# Patient Record
Sex: Female | Born: 1935 | Race: Black or African American | Hispanic: No | Marital: Married | Smoking: Never smoker
Health system: Southern US, Community
[De-identification: ages and names within clinical notes are randomized; demographics above are authoritative.]

## PROBLEM LIST (undated history)

## (undated) DIAGNOSIS — M810 Age-related osteoporosis without current pathological fracture: Secondary | ICD-10-CM

## (undated) DIAGNOSIS — E785 Hyperlipidemia, unspecified: Secondary | ICD-10-CM

## (undated) DIAGNOSIS — E079 Disorder of thyroid, unspecified: Secondary | ICD-10-CM

## (undated) DIAGNOSIS — E119 Type 2 diabetes mellitus without complications: Secondary | ICD-10-CM

## (undated) DIAGNOSIS — M1990 Unspecified osteoarthritis, unspecified site: Secondary | ICD-10-CM

## (undated) HISTORY — DX: Age-related osteoporosis without current pathological fracture: M81.0

## (undated) HISTORY — DX: Unspecified osteoarthritis, unspecified site: M19.90

## (undated) HISTORY — PX: ABDOMINAL HYSTERECTOMY: SHX81

## (undated) HISTORY — DX: Type 2 diabetes mellitus without complications: E11.9

## (undated) HISTORY — DX: Hyperlipidemia, unspecified: E78.5

## (undated) HISTORY — DX: Disorder of thyroid, unspecified: E07.9

---

## 2004-02-22 ENCOUNTER — Ambulatory Visit: Payer: Self-pay | Admitting: Podiatry

## 2004-11-21 ENCOUNTER — Ambulatory Visit: Payer: Self-pay | Admitting: Internal Medicine

## 2005-11-24 ENCOUNTER — Ambulatory Visit: Payer: Self-pay | Admitting: Internal Medicine

## 2006-11-29 ENCOUNTER — Ambulatory Visit: Payer: Self-pay | Admitting: Internal Medicine

## 2007-03-25 ENCOUNTER — Ambulatory Visit: Payer: Self-pay | Admitting: Gastroenterology

## 2007-03-25 HISTORY — PX: COLONOSCOPY: SHX174

## 2007-12-13 ENCOUNTER — Ambulatory Visit: Payer: Self-pay | Admitting: Internal Medicine

## 2007-12-15 ENCOUNTER — Ambulatory Visit: Payer: Self-pay | Admitting: Internal Medicine

## 2008-12-17 ENCOUNTER — Ambulatory Visit: Payer: Self-pay | Admitting: Internal Medicine

## 2009-02-04 DEATH — deceased

## 2009-12-19 ENCOUNTER — Ambulatory Visit: Payer: Self-pay | Admitting: Internal Medicine

## 2010-10-09 ENCOUNTER — Ambulatory Visit: Payer: Self-pay

## 2010-12-23 ENCOUNTER — Ambulatory Visit: Payer: Self-pay | Admitting: Family Medicine

## 2011-01-28 ENCOUNTER — Ambulatory Visit: Payer: Self-pay

## 2011-02-18 ENCOUNTER — Ambulatory Visit: Payer: Self-pay

## 2011-12-24 ENCOUNTER — Ambulatory Visit: Payer: Self-pay | Admitting: Family Medicine

## 2013-01-19 ENCOUNTER — Ambulatory Visit: Payer: Self-pay | Admitting: Family Medicine

## 2013-06-26 DIAGNOSIS — M353 Polymyalgia rheumatica: Secondary | ICD-10-CM | POA: Insufficient documentation

## 2013-06-26 DIAGNOSIS — M1990 Unspecified osteoarthritis, unspecified site: Secondary | ICD-10-CM | POA: Insufficient documentation

## 2013-07-25 ENCOUNTER — Emergency Department: Payer: Self-pay | Admitting: Emergency Medicine

## 2013-09-10 ENCOUNTER — Emergency Department: Payer: Self-pay | Admitting: Emergency Medicine

## 2013-09-18 DIAGNOSIS — M7120 Synovial cyst of popliteal space [Baker], unspecified knee: Secondary | ICD-10-CM | POA: Insufficient documentation

## 2014-02-08 DIAGNOSIS — R7303 Prediabetes: Secondary | ICD-10-CM | POA: Insufficient documentation

## 2014-02-08 DIAGNOSIS — E7800 Pure hypercholesterolemia, unspecified: Secondary | ICD-10-CM | POA: Insufficient documentation

## 2014-02-08 DIAGNOSIS — E669 Obesity, unspecified: Secondary | ICD-10-CM | POA: Insufficient documentation

## 2014-05-02 ENCOUNTER — Ambulatory Visit: Payer: Self-pay | Admitting: Podiatry

## 2015-08-27 DIAGNOSIS — G8929 Other chronic pain: Secondary | ICD-10-CM | POA: Insufficient documentation

## 2015-08-27 DIAGNOSIS — M25511 Pain in right shoulder: Secondary | ICD-10-CM | POA: Insufficient documentation

## 2015-09-26 DIAGNOSIS — Z87898 Personal history of other specified conditions: Secondary | ICD-10-CM | POA: Insufficient documentation

## 2015-10-29 DIAGNOSIS — M1712 Unilateral primary osteoarthritis, left knee: Secondary | ICD-10-CM | POA: Insufficient documentation

## 2015-12-25 DIAGNOSIS — M170 Bilateral primary osteoarthritis of knee: Secondary | ICD-10-CM | POA: Insufficient documentation

## 2016-01-29 ENCOUNTER — Other Ambulatory Visit: Payer: Self-pay | Admitting: Internal Medicine

## 2016-01-29 DIAGNOSIS — M25361 Other instability, right knee: Secondary | ICD-10-CM

## 2016-02-08 ENCOUNTER — Ambulatory Visit
Admission: RE | Admit: 2016-02-08 | Discharge: 2016-02-08 | Disposition: A | Discharge status: Home / Self Care | Payer: Medicare Other | Source: Ambulatory Visit | Attending: Internal Medicine | Admitting: Internal Medicine

## 2016-02-08 ENCOUNTER — Ambulatory Visit: Payer: Self-pay

## 2016-02-08 DIAGNOSIS — M25361 Other instability, right knee: Secondary | ICD-10-CM | POA: Diagnosis present

## 2016-02-08 DIAGNOSIS — M7121 Synovial cyst of popliteal space [Baker], right knee: Secondary | ICD-10-CM | POA: Diagnosis not present

## 2016-02-08 DIAGNOSIS — M25461 Effusion, right knee: Secondary | ICD-10-CM | POA: Insufficient documentation

## 2016-02-08 DIAGNOSIS — M659 Synovitis and tenosynovitis, unspecified: Secondary | ICD-10-CM | POA: Insufficient documentation

## 2016-02-08 DIAGNOSIS — S83281A Other tear of lateral meniscus, current injury, right knee, initial encounter: Secondary | ICD-10-CM | POA: Diagnosis not present

## 2016-02-08 DIAGNOSIS — X58XXXA Exposure to other specified factors, initial encounter: Secondary | ICD-10-CM | POA: Diagnosis not present

## 2016-04-20 DIAGNOSIS — M25512 Pain in left shoulder: Secondary | ICD-10-CM | POA: Insufficient documentation

## 2016-05-04 DIAGNOSIS — M8589 Other specified disorders of bone density and structure, multiple sites: Secondary | ICD-10-CM | POA: Insufficient documentation

## 2016-09-24 DIAGNOSIS — Z66 Do not resuscitate: Secondary | ICD-10-CM | POA: Insufficient documentation

## 2016-09-24 DIAGNOSIS — Z0000 Encounter for general adult medical examination without abnormal findings: Secondary | ICD-10-CM | POA: Insufficient documentation

## 2017-04-15 DIAGNOSIS — G8929 Other chronic pain: Secondary | ICD-10-CM | POA: Insufficient documentation

## 2018-04-14 DIAGNOSIS — M25659 Stiffness of unspecified hip, not elsewhere classified: Secondary | ICD-10-CM | POA: Insufficient documentation

## 2018-09-08 DIAGNOSIS — M25551 Pain in right hip: Secondary | ICD-10-CM | POA: Insufficient documentation

## 2018-10-28 ENCOUNTER — Other Ambulatory Visit: Payer: Self-pay

## 2018-10-28 ENCOUNTER — Other Ambulatory Visit: Payer: Self-pay | Admitting: Internal Medicine

## 2018-10-28 DIAGNOSIS — G8929 Other chronic pain: Secondary | ICD-10-CM

## 2018-10-28 DIAGNOSIS — Z20822 Contact with and (suspected) exposure to covid-19: Secondary | ICD-10-CM

## 2018-10-31 LAB — NOVEL CORONAVIRUS, NAA: SARS-CoV-2, NAA: NOT DETECTED

## 2018-11-09 ENCOUNTER — Other Ambulatory Visit
Admission: RE | Admit: 2018-11-09 | Discharge: 2018-11-09 | Disposition: A | Discharge status: Home / Self Care | Payer: Medicare Other | Source: Ambulatory Visit | Attending: Internal Medicine | Admitting: Internal Medicine

## 2018-11-09 DIAGNOSIS — M1712 Unilateral primary osteoarthritis, left knee: Secondary | ICD-10-CM | POA: Insufficient documentation

## 2018-11-09 LAB — SYNOVIAL CELL COUNT + DIFF, W/ CRYSTALS
Crystals, Fluid: NONE SEEN
Eosinophils-Synovial: 0 %
Lymphocytes-Synovial Fld: 21 %
Monocyte-Macrophage-Synovial Fluid: 61 %
Neutrophil, Synovial: 18 %
WBC, Synovial: 419 /mm3 — ABNORMAL HIGH (ref 0–200)

## 2018-11-10 ENCOUNTER — Ambulatory Visit
Admission: RE | Admit: 2018-11-10 | Discharge: 2018-11-10 | Disposition: A | Discharge status: Home / Self Care | Payer: Medicare Other | Source: Ambulatory Visit | Attending: Internal Medicine | Admitting: Internal Medicine

## 2018-11-10 ENCOUNTER — Other Ambulatory Visit: Payer: Self-pay

## 2018-11-10 DIAGNOSIS — G8929 Other chronic pain: Secondary | ICD-10-CM | POA: Insufficient documentation

## 2018-11-10 DIAGNOSIS — M25551 Pain in right hip: Secondary | ICD-10-CM | POA: Insufficient documentation

## 2018-12-20 DIAGNOSIS — G8929 Other chronic pain: Secondary | ICD-10-CM | POA: Insufficient documentation

## 2019-02-10 DIAGNOSIS — E538 Deficiency of other specified B group vitamins: Secondary | ICD-10-CM | POA: Insufficient documentation

## 2019-08-18 DIAGNOSIS — F418 Other specified anxiety disorders: Secondary | ICD-10-CM | POA: Insufficient documentation

## 2019-10-17 DIAGNOSIS — I10 Essential (primary) hypertension: Secondary | ICD-10-CM | POA: Insufficient documentation

## 2019-10-23 ENCOUNTER — Other Ambulatory Visit: Payer: Self-pay

## 2019-10-23 ENCOUNTER — Encounter (INDEPENDENT_AMBULATORY_CARE_PROVIDER_SITE_OTHER): Payer: Self-pay | Admitting: Vascular Surgery

## 2019-10-23 ENCOUNTER — Encounter (INDEPENDENT_AMBULATORY_CARE_PROVIDER_SITE_OTHER): Payer: Self-pay

## 2019-10-23 ENCOUNTER — Ambulatory Visit (INDEPENDENT_AMBULATORY_CARE_PROVIDER_SITE_OTHER): Payer: Medicare Other | Admitting: Vascular Surgery

## 2019-10-23 VITALS — BP 151/71 | HR 94 | Resp 18 | Ht 59.0 in | Wt 175.0 lb

## 2019-10-23 DIAGNOSIS — I739 Peripheral vascular disease, unspecified: Secondary | ICD-10-CM

## 2019-10-23 DIAGNOSIS — M79605 Pain in left leg: Secondary | ICD-10-CM

## 2019-10-23 DIAGNOSIS — I872 Venous insufficiency (chronic) (peripheral): Secondary | ICD-10-CM | POA: Insufficient documentation

## 2019-10-23 DIAGNOSIS — M79606 Pain in leg, unspecified: Secondary | ICD-10-CM | POA: Insufficient documentation

## 2019-10-23 DIAGNOSIS — M170 Bilateral primary osteoarthritis of knee: Secondary | ICD-10-CM | POA: Diagnosis not present

## 2019-10-23 DIAGNOSIS — M79604 Pain in right leg: Secondary | ICD-10-CM

## 2019-10-23 NOTE — Progress Notes (Signed)
MRN : [id number]  Jasmin Jones is a 86 y.o. (11/27/1930) female who presents with chief complaint of No chief complaint on file. Marland Kitchen  History of Present Illness:   The patient is seen for evaluation of painful lower extremities. Patient notes the pain is variable and not always associated with activity.  The pain is somewhat consistent day to day occurring on most days. The patient notes the pain also occurs with standing and routinely seems worse as the day wears on. The pain has been progressive over the past several years. The patient states these symptoms are causing  a profound negative impact on quality of life and daily activities.  The patient denies rest pain or dangling of an extremity off the side of the bed during the night for relief. No open wounds or sores at this time. No history of DVT or phlebitis. No prior interventions or surgeries.  There is a  history of back problems and DJD of the lumbar and sacral spine.    No outpatient medications have been marked as taking for the 10/23/19 encounter (Appointment) with Gilda Crease, Latina Craver, MD.    No past medical history on file.    Social History Social History   Tobacco Use  . Smoking status: Not on file  Substance Use Topics  . Alcohol use: Not on file  . Drug use: Not on file    Family History No family history of bleeding/clotting disorders, porphyria or autoimmune disease   Not on File   REVIEW OF SYSTEMS (Negative unless checked)  Constitutional: [] Weight loss  [] Fever  [] Chills Cardiac: [] Chest pain   [] Chest pressure   [] Palpitations   [] Shortness of breath when laying flat   [] Shortness of breath with exertion. Vascular:  [x] Pain in legs with walking   [x] Pain in legs at rest  [] History of DVT   [] Phlebitis   [] Swelling in legs   [] Varicose veins   [] Non-healing ulcers Pulmonary:   [] Uses home oxygen   [] Productive cough   [] Hemoptysis   [] Wheeze  [] COPD   [] Asthma Neurologic:  [] Dizziness    [] Seizures   [] History of stroke   [] History of TIA  [] Aphasia   [] Vissual changes   [] Weakness or numbness in arm   [] Weakness or numbness in leg Musculoskeletal:   [] Joint swelling   [x] Joint pain   [x] Low back pain Hematologic:  [] Easy bruising  [] Easy bleeding   [] Hypercoagulable state   [] Anemic Gastrointestinal:  [] Diarrhea   [] Vomiting  [] Gastroesophageal reflux/heartburn   [] Difficulty swallowing. Genitourinary:  [] Chronic kidney disease   [] Difficult urination  [] Frequent urination   [] Blood in urine Skin:  [] Rashes   [] Ulcers  Psychological:  [] History of anxiety   []  History of major depression.  Physical Examination  There were no vitals filed for this visit. There is no height or weight on file to calculate BMI. Gen: WD/WN, NAD Head: [place]/AT, No temporalis wasting.  Ear/Nose/Throat: Hearing grossly intact, nares w/o erythema or drainage, poor dentition Eyes: PER, EOMI, sclera nonicteric.  Neck: Supple, no masses.  No bruit or JVD.  Pulmonary:  Good air movement, clear to auscultation bilaterally, no use of accessory muscles.  Cardiac: RRR, normal S1, S2, no Murmurs. Vascular: no carotid bruits; scattered varicosities present bilaterally.  Mild venous stasis changes to the legs bilaterally.  2+ soft pitting edema Vessel Right Left  Radial Palpable Palpable  PT Not Palpable Palpable  DP Not Palpable Palpable  Gastrointestinal: soft, non-distended. No guarding/no peritoneal signs.  Musculoskeletal:  M/S 5/5 throughout.  No deformity or atrophy.  Neurologic: CN 2-12 intact. Pain and light touch intact in extremities.  Symmetrical.  Speech is fluent. Motor exam as listed above. Psychiatric: Judgment intact, Mood & affect appropriate for pt's clinical situation. Dermatologic: Mild venous rashes no ulcers noted.  No changes consistent with cellulitis.   CBC No results found for: WBC, HGB, HCT, MCV, PLT  BMET No results found for: NA, K, CL, CO2, GLUCOSE, BUN, CREATININE,  CALCIUM, GFRNONAA, GFRAA CrCl cannot be calculated (No successful lab value found.).  COAG No results found for: INR, PROTIME  Radiology No results found.    Assessment/Plan 1. Pain in both lower extremities Recommend:  The patient has atypical pain symptoms for pure atherosclerotic disease. However, on physical exam there is evidence of mixed venous and arterial disease, given the diminished pulses and the edema associated with venous changes of the legs.  Noninvasive studies including ABI's will be obtained and the patient will follow up with me to review these studies.  I suspect the patient is c/o pseudoclaudication.  Patient should have an evaluation of his LS spine which I defer to the primary service.  The patient should continue walking and begin a more formal exercise program. The patient should continue his antiplatelet therapy and aggressive treatment of the lipid abnormalities.  The patient should begin wearing graduated compression socks 10-15 mmHg strength to control edema.  - VAS [place] ABI WITH/WO TBI; Future  2. PAD (peripheral artery disease) (HCC) Recommend:  The patient has atypical pain symptoms for pure atherosclerotic disease. However, on physical exam there is evidence of mixed venous and arterial disease, given the diminished pulses and the edema associated with venous changes of the legs.  Noninvasive studies including ABI's will be obtained and the patient will follow up with me to review these studies.  I suspect the patient is c/o pseudoclaudication.  Patient should have an evaluation of his LS spine which I defer to the primary service.  The patient should continue walking and begin a more formal exercise program. The patient should continue his antiplatelet therapy and aggressive treatment of the lipid abnormalities.  The patient should begin wearing graduated compression socks 10-15 mmHg strength to control edema.  - VAS [place] ABI WITH/WO TBI;  Future  3. Chronic venous insufficiency I have had a long discussion with the patient regarding swelling and why it  causes symptoms.  Patient will begin wearing graduated compression stockings on a daily basis a prescription was given. The patient will  beginning wearing the stockings first thing in the morning and removing them in the evening. The patient is instructed specifically not to sleep in the stockings.   In addition, behavioral modification will be initiated.  This will include frequent elevation, use of over the counter pain medications and exercise such as walking.  I have reviewed systemic causes for chronic edema such as liver, kidney and cardiac etiologies.  The patient denies problems with these organ systems.    Consideration for a lymph pump will also be made based upon the effectiveness of conservative therapy.  This would help to improve the edema control and prevent sequela such as ulcers and infections    4. Primary osteoarthritis of both knees Continue NSAID medications as already ordered, these medications have been reviewed and there are no changes at this time.  Continued activity and therapy was stressed.     Levora Dredge, MD  10/23/2019 8:21 AM

## 2019-11-15 ENCOUNTER — Ambulatory Visit (INDEPENDENT_AMBULATORY_CARE_PROVIDER_SITE_OTHER): Payer: Medicare Other

## 2019-11-15 ENCOUNTER — Ambulatory Visit (INDEPENDENT_AMBULATORY_CARE_PROVIDER_SITE_OTHER): Payer: Medicare Other | Admitting: Nurse Practitioner

## 2019-11-15 ENCOUNTER — Encounter (INDEPENDENT_AMBULATORY_CARE_PROVIDER_SITE_OTHER): Payer: Self-pay | Admitting: Nurse Practitioner

## 2019-11-15 ENCOUNTER — Other Ambulatory Visit: Payer: Self-pay

## 2019-11-15 VITALS — BP 131/79 | HR 81 | Ht 59.0 in | Wt 174.0 lb

## 2019-11-15 DIAGNOSIS — M79605 Pain in left leg: Secondary | ICD-10-CM | POA: Diagnosis not present

## 2019-11-15 DIAGNOSIS — M79604 Pain in right leg: Secondary | ICD-10-CM

## 2019-11-15 DIAGNOSIS — M170 Bilateral primary osteoarthritis of knee: Secondary | ICD-10-CM

## 2019-11-15 DIAGNOSIS — I10 Essential (primary) hypertension: Secondary | ICD-10-CM

## 2019-11-15 DIAGNOSIS — I739 Peripheral vascular disease, unspecified: Secondary | ICD-10-CM | POA: Diagnosis not present

## 2019-11-15 NOTE — Progress Notes (Signed)
Subjective:    Patient ID: Jasmin Jones, female    DOB: 1931-02-14, 86 y.o.   MRN: [id number] Chief Complaint  Patient presents with  . Follow-up    U/S follow up    The patient presents today to follow-up with noninvasive studies due to bilateral lower extremity leg pain.  The patient has a known history of osteoarthritis in both her hip as well as her knee.  The patient notes that she recently had a shot in her back and that has helped a good deal of her lower extremity symptoms.  She continues to have knee pain.  The patient also has polymyalgia rheumatica.  The patient was sent to rule out for peripheral arterial disease due to the extensiveness of her pain, to ensure that a more urgent vascular problem is not missed.  She denies any claudication-like symptoms.  She denies any fever, chills, nausea, vomiting or diarrhea.  She denies any TIA-like symptoms.  She denies any chest pain or shortness of breath.  Today noninvasive studies show ABI 1.14 on the right and 1.23 on the left.  The patient has a TBI 0.7 on the right and 0.93 on the left.  The patient has triphasic tibial artery waveforms on the left with biphasic/triphasic waveforms in the tibial arteries on the right.  The patient has strong toe waveforms bilaterally.   Review of Systems  Musculoskeletal: Positive for arthralgias, joint swelling and myalgias.  Neurological: Positive for weakness.  All other systems reviewed and are negative.      Objective:   Physical Exam Vitals reviewed.  HENT:     Head: Normocephalic.  Cardiovascular:     Rate and Rhythm: Normal rate and regular rhythm.     Pulses: Normal pulses.          Dorsalis pedis pulses are 2+ on the right side and 2+ on the left side.       Posterior tibial pulses are 2+ on the right side and 2+ on the left side.     Heart sounds: Normal heart sounds.  Pulmonary:     Effort: Pulmonary effort is normal.  Musculoskeletal:     Right lower leg: 2+ Edema present.       Left lower leg: 2+ Edema present.  Neurological:     Mental Status: She is alert and oriented to person, place, and time.     Motor: Weakness present.     Gait: Gait abnormal.  Psychiatric:        Mood and Affect: Mood normal.        Behavior: Behavior normal.        Thought Content: Thought content normal.        Judgment: Judgment normal.     BP 131/79   Pulse 81   Ht 4\' 11"  (1.499 m)   Wt 174 lb (78.9 kg)   BMI 35.14 kg/m   Past Medical History:  Diagnosis Date  . Diabetes mellitus without complication (HCC)   . Hyperlipidemia   . Osteoarthritis   . Osteoporosis   . Thyroid disease     Social History   Socioeconomic History  . Marital status: Married    Spouse name: Not on file  . Number of children: Not on file  . Years of education: Not on file  . Highest education level: Not on file  Occupational History  . Not on file  Tobacco Use  . Smoking status: Never Smoker  . Smokeless tobacco: Never Used  Substance and Sexual Activity  . Alcohol use: Not on file  . Drug use: Not on file  . Sexual activity: Not on file  Other Topics Concern  . Not on file  Social History Narrative  . Not on file   Social Determinants of Health   Financial Resource Strain:   . Difficulty of Paying Living Expenses:   Food Insecurity:   . Worried About Programme researcher, broadcasting/film/video in the Last Year:   . Barista in the Last Year:   Transportation Needs:   . Freight forwarder (Medical):   Marland Kitchen Lack of Transportation (Non-Medical):   Physical Activity:   . Days of Exercise per Week:   . Minutes of Exercise per Session:   Stress:   . Feeling of Stress :   Social Connections:   . Frequency of Communication with Friends and Family:   . Frequency of Social Gatherings with Friends and Family:   . Attends Religious Services:   . Active Member of Clubs or Organizations:   . Attends Banker Meetings:   Marland Kitchen Marital Status:   Intimate Partner Violence:   . Fear of  Current or Ex-Partner:   . Emotionally Abused:   Marland Kitchen Physically Abused:   . Sexually Abused:     Past Surgical History:  Procedure Laterality Date  . ABDOMINAL HYSTERECTOMY    . ABDOMINAL HYSTERECTOMY    . COLONOSCOPY  03/25/2007    Family History  Problem Relation Age of Onset  . Heart disease Mother   . Hypertension Father   . Diabetes Brother   . Heart attack Brother   . Cancer Maternal Uncle     Allergies  Allergen Reactions  . Lisinopril Cough       Assessment & Plan:   1. Pain in both lower extremities Recommend:  I do not find evidence of Vascular pathology that would explain the patient's symptoms  The patient has atypical pain symptoms for vascular disease  The patient does have known degenerative disc disease in her hip and knees, as well as polymyalgia rheumatica, these are being addressed by her rheumatologist as well as primary care physician.  Noninvasive studies  of the legs do not identify vascular problems  The patient should continue walking and begin a more formal exercise program. The patient should continue his antiplatelet therapy and aggressive treatment of the lipid abnormalities. The patient should begin wearing graduated compression socks 15-20 mmHg strength to control her mild edema.  Patient will follow-up with me on a PRN basis  Further work-up of her lower extremity pain is deferred to the primary service     2. Essential hypertension Continue antihypertensive medications as already ordered, these medications have been reviewed and there are no changes at this time.   3. Primary osteoarthritis of both knees Continue NSAID medications as already ordered, these medications have been reviewed and there are no changes at this time.  Continued activity and therapy was stressed.    Current Outpatient Medications on File Prior to Visit  Medication Sig Dispense Refill  . acetaminophen (TYLENOL) 500 MG tablet Take by mouth.    Marland Kitchen  albuterol (VENTOLIN HFA) 108 (90 Base) MCG/ACT inhaler Inhale into the lungs.    Marland Kitchen atorvastatin (LIPITOR) 20 MG tablet Take by mouth.    . Calcium Carbonate-Vitamin D 600-200 MG-UNIT TABS Take 2 tablets by mouth daily.    . cyanocobalamin 1000 MCG tablet Take by mouth.    . escitalopram (LEXAPRO)  10 MG tablet Take 10 mg by mouth daily.    Marland Kitchen escitalopram (LEXAPRO) 5 MG tablet Take by mouth.    . fluticasone (FLONASE) 50 MCG/ACT nasal spray SPRAY 2 SPRAYS INTO EACH NOSTRIL EVERY DAY    . hydrochlorothiazide (HYDRODIURIL) 12.5 MG tablet Take 1 tablet by mouth daily.    Marland Kitchen levocetirizine (XYZAL) 5 MG tablet every evening.    Marland Kitchen losartan (COZAAR) 50 MG tablet Take by mouth.    Marland Kitchen ofloxacin (OCUFLOX) 0.3 % ophthalmic solution Place 1 drop into the right eye 4 (four) times daily.     No current facility-administered medications on file prior to visit.    There are no Patient Instructions on file for this visit. No follow-ups on file.   Georgiana Spinner, NP

## 2020-05-19 ENCOUNTER — Encounter: Payer: Self-pay | Admitting: Emergency Medicine

## 2020-05-19 ENCOUNTER — Other Ambulatory Visit: Payer: Self-pay

## 2020-05-19 ENCOUNTER — Emergency Department
Admission: EM | Admit: 2020-05-19 | Discharge: 2020-05-19 | Disposition: A | Discharge status: Home / Self Care | Payer: Medicare Other | Attending: Emergency Medicine | Admitting: Emergency Medicine

## 2020-05-19 DIAGNOSIS — I10 Essential (primary) hypertension: Secondary | ICD-10-CM | POA: Diagnosis not present

## 2020-05-19 DIAGNOSIS — Z79899 Other long term (current) drug therapy: Secondary | ICD-10-CM | POA: Insufficient documentation

## 2020-05-19 DIAGNOSIS — E119 Type 2 diabetes mellitus without complications: Secondary | ICD-10-CM | POA: Insufficient documentation

## 2020-05-19 LAB — BASIC METABOLIC PANEL
Anion gap: 9 (ref 5–15)
BUN: 13 mg/dL (ref 8–23)
CO2: 29 mmol/L (ref 22–32)
Calcium: 9.1 mg/dL (ref 8.9–10.3)
Chloride: 98 mmol/L (ref 98–111)
Creatinine, Ser: 0.58 mg/dL (ref 0.44–1.00)
GFR, Estimated: >60 mL/min (ref >60)
Glucose, Bld: 125 mg/dL — ABNORMAL HIGH (ref 70–99)
Potassium: 3.5 mmol/L (ref 3.5–5.1)
Sodium: 136 mmol/L (ref 135–145)

## 2020-05-19 LAB — CBC
HCT: 38 % (ref 36.0–46.0)
Hemoglobin: 12.3 g/dL (ref 12.0–15.0)
MCH: 30.2 pg (ref 26.0–34.0)
MCHC: 32.4 g/dL (ref 30.0–36.0)
MCV: 93.4 fL (ref 80.0–100.0)
Platelets: 255 10*3/uL (ref 150–400)
RBC: 4.07 MIL/uL (ref 3.87–5.11)
RDW: 12.8 % (ref 11.5–15.5)
WBC: 6.6 10*3/uL (ref 4.0–10.5)
nRBC: 0 % (ref 0.0–0.2)

## 2020-05-19 LAB — TROPONIN I (HIGH SENSITIVITY): Troponin I (High Sensitivity): 3 ng/L (ref <18)

## 2020-05-19 NOTE — ED Provider Notes (Signed)
Medical screening examination/treatment/procedure(s) were conducted as a shared visit with non-physician practitioner(s) and myself.  I personally evaluated the patient during the encounter.    ED ECG REPORT I, Sharyn Creamer, the attending physician, personally viewed and interpreted this ECG.  Date: 05/19/2020 EKG Time: 1016 Rate: 90 Rhythm: normal sinus rhythm QRS Axis: normal Intervals: normal ST/T Wave abnormalities: normal Narrative Interpretation: no evidence of acute ischemia    Sharyn Creamer, MD 05/19/20 1433

## 2020-05-19 NOTE — ED Provider Notes (Signed)
[facility] Emergency Department Provider Note  ____________________________________________  1431  I have reviewed the triage vital signs and the nursing notes.   HISTORY  Chief Complaint Hypertension  HPI Jasmin Jones is a 87 y.o. female presents herself to the ED for evaluation of concern over elevated blood pressure readings this morning.  Patient describes BP with systolics in the 190s & dyastolics in the low 100s, upon awakening this morning.  She denies any preceding or ongoing rest pain or shortness of breath.  Patient reports taking her blood pressure medicines as prescribed.  She denies any other complaints at this time.  Past Medical History:  Diagnosis Date  . Diabetes mellitus without complication (HCC)   . Hyperlipidemia   . Osteoarthritis   . Osteoporosis   . Thyroid disease     Patient Active Problem List   Diagnosis Date Noted  . Leg pain 10/23/2019  . PAD (peripheral artery disease) (HCC) 10/23/2019  . Chronic venous insufficiency 10/23/2019  . Essential hypertension 10/17/2019  . Situational anxiety 08/18/2019  . B12 deficiency 02/10/2019  . Chronic bilateral low back pain without sciatica 12/20/2018  . Chronic right hip pain 09/08/2018  . Stiffness of hip joint 04/14/2018  . Chronic thumb pain, left 04/15/2017  . DNR (do not resuscitate) 09/24/2016  . Medicare annual wellness visit, subsequent 09/24/2016  . Osteopenia of multiple sites 05/04/2016  . Bilateral shoulder pain 04/20/2016  . Primary osteoarthritis of both knees 12/25/2015  . Localized osteoarthritis of left knee 10/29/2015  . History of chronic health problem 09/26/2015  . Chronic pain in right shoulder 08/27/2015  . Chronic pain of left knee 08/27/2015  . Borderline diabetes 02/08/2014  . Obesity (BMI 35.0-39.9 without comorbidity) 02/08/2014  . Pure hypercholesterolemia 02/08/2014  . Baker's cyst of knee 09/18/2013  . PMR (polymyalgia rheumatica) (HCC)  06/26/2013  . Unspecified osteoarthritis, unspecified site 06/26/2013    Past Surgical History:  Procedure Laterality Date  . ABDOMINAL HYSTERECTOMY    . ABDOMINAL HYSTERECTOMY    . COLONOSCOPY  03/25/2007    Prior to Admission medications   Medication Sig Start Date End Date Taking? Authorizing Provider  acetaminophen (TYLENOL) 500 MG tablet Take by mouth. 12/20/18   [provider]  albuterol (VENTOLIN HFA) 108 (90 Base) MCG/ACT inhaler Inhale into the lungs. 05/12/19   [provider]  atorvastatin (LIPITOR) 20 MG tablet Take by mouth. 08/02/19   [provider]  Calcium Carbonate-Vitamin D 600-200 MG-UNIT TABS Take 2 tablets by mouth daily.    [provider]  cyanocobalamin 1000 MCG tablet Take by mouth.    [provider]  escitalopram (LEXAPRO) 10 MG tablet Take 10 mg by mouth daily.    [provider]  escitalopram (LEXAPRO) 5 MG tablet Take by mouth. 09/11/19 12/10/19  [provider]  fluticasone (FLONASE) 50 MCG/ACT nasal spray SPRAY 2 SPRAYS INTO EACH NOSTRIL EVERY DAY 08/03/19   [provider]  hydrochlorothiazide (HYDRODIURIL) 12.5 MG tablet Take 1 tablet by mouth daily. 05/30/19   [provider]  levocetirizine (XYZAL) 5 MG tablet every evening. 04/28/19   [provider]  losartan (COZAAR) 50 MG tablet Take by mouth. 07/12/19 07/11/20  [provider]  ofloxacin (OCUFLOX) 0.3 % ophthalmic solution Place 1 drop into the right eye 4 (four) times daily. 09/30/19   [provider]    Allergies Lisinopril  Family History  Problem Relation Age of Onset  . Heart disease Mother   .  Hypertension Father   . Diabetes Brother   . Heart attack Brother   . Cancer Maternal Uncle     Social History Social History   Tobacco Use  . Smoking status: Never Smoker  . Smokeless tobacco: Never Used    Review of Systems Constitutional: No fever/chills Eyes: No visual changes. ENT: No  sore throat. Cardiovascular: Denies chest pain. Respiratory: Denies shortness of breath. Gastrointestinal: No abdominal pain.  No nausea, no vomiting.  No diarrhea.  No constipation. Genitourinary: Negative for dysuria. Musculoskeletal: Negative for back pain. Skin: Negative for rash. Neurological: Negative for headaches, focal weakness or numbness. ____________________________________________   PHYSICAL EXAM:  VITAL SIGNS: ED Triage Vitals  Enc Vitals Group     BP 05/19/20 1249 (!) 145/76     Pulse Rate 05/19/20 1249 80     Resp 05/19/20 1249 19     Temp --      Temp src --      SpO2 05/19/20 1249 98 %     Weight 05/19/20 1016 175 lb (79.4 kg)     Height 05/19/20 1016 4\' 11"  (1.499 m)     Head Circumference --      Peak Flow --      Pain Score 05/19/20 1016 0     Pain Loc --      Pain Edu? --      Excl. in GC? --    Constitutional: Alert and oriented. Well appearing and in no acute distress. Eyes: Conjunctivae are normal. EOMI. Head: Atraumatic. Nose: No congestion/rhinnorhea. Mouth/Throat: Mucous membranes are moist.  Oropharynx non-erythematous. Neck: No stridor.   Cardiovascular: Normal rate, regular rhythm. Grossly normal heart sounds.  Good peripheral circulation. Respiratory: Normal respiratory effort.  No retractions. Lungs CTAB. Gastrointestinal: Soft and nontender. No distention. No abdominal bruits. No CVA tenderness. Musculoskeletal: No lower extremity tenderness nor edema.  No joint effusions. Neurologic:  Normal speech and language. No gross focal neurologic deficits are appreciated. No gait instability. Skin:  Skin is warm, dry and intact. No rash noted. Psychiatric: Mood and affect are normal. Speech and behavior are normal. ____________________________________________   LABS (all labs ordered are listed, but only abnormal results are displayed)  Labs Reviewed  BASIC METABOLIC PANEL - Abnormal; Notable for the following components:      Result Value    Glucose, Bld 125 (*)    All other components within normal limits  CBC  TROPONIN I (HIGH SENSITIVITY)  TROPONIN I (HIGH SENSITIVITY)   ____________________________________________  EKG  See EKG report ____________________________________________  RADIOLOGY I, 05/21/20, personally viewed and evaluated these images (plain radiographs) as part of my medical decision making, as well as reviewing the written report by the radiologist.  ED MD interpretation:    Official radiology report(s): No results found.  ____________________________________________   PROCEDURES  Procedure(s) performed (including Critical Care):  Procedures   ____________________________________________   INITIAL IMPRESSION / ASSESSMENT AND PLAN / ED COURSE  As part of my medical decision making, I reviewed the following data within the electronic MEDICAL RECORD NUMBER Labs reviewed WNL without elevated troponin, EKG interpreted no significant changes from prior EKG, Notes from prior ED visits and [place] Controlled Substance Database     Geriatric patient with ED evaluation of elevated blood pressures this morning without subsequent complaint of chest pain, shortness of breath, or visual disturbance.  Patient's exam is overall nonreturn at this time.  Labs within normal limits initial troponin.  Patient's blood pressures have normalized to  140s over 76 and the patient is again without complaint at this time.  She will be discharged to follow-up with primary provider for ongoing symptoms.  Return precautions have been discussed. ____________________________________________   FINAL CLINICAL IMPRESSION(S) / ED DIAGNOSES  Final diagnoses:  Hypertension, unspecified type     ED Discharge Orders    None      *Please note:  Jasmin Jones was evaluated in Emergency Department on 05/19/2020 for the symptoms described in the history of present illness. She was evaluated in the context of the global  COVID-19 pandemic, which necessitated consideration that the patient might be at risk for infection with the SARS-CoV-2 virus that causes COVID-19. Institutional protocols and algorithms that pertain to the evaluation of patients at risk for COVID-19 are in a state of rapid change based on information released by regulatory bodies including the CDC and federal and state organizations. These policies and algorithms were followed during the patient's care in the ED.  Some ED evaluations and interventions may be delayed as a result of limited staffing during and the pandemic.*   Note:  This document was prepared using Dragon voice recognition software and may include unintentional dictation errors.    Lissa Hoard, PA-C 05/19/20 1446    Sharyn Creamer, MD 05/19/20 1517

## 2020-05-19 NOTE — ED Notes (Signed)
Pt verbalized understanding of d/c instructions at this time. Pt denied further questions at this time. Pt ambulatory to lobby at this time, NAD noted, steady gait noted, RR even and unlabored at this time.  

## 2020-05-19 NOTE — Discharge Instructions (Signed)
Your exam and labs are normal at this time.  No signs of any serious in infection or heart damage.  Follow-up with your primary provider tomorrow as planned.  Return to the ED if needed.

## 2020-05-19 NOTE — ED Triage Notes (Signed)
Pt reports her blood pressure has been going up. Pt denies any symptoms. Pt states that she does not feel bad when it goes up she just doesn't know why it goes up

## 2020-09-22 ENCOUNTER — Emergency Department
Admission: EM | Admit: 2020-09-22 | Discharge: 2020-09-22 | Disposition: A | Discharge status: Home / Self Care | Payer: Medicare Other | Attending: Emergency Medicine | Admitting: Emergency Medicine

## 2020-09-22 ENCOUNTER — Other Ambulatory Visit: Payer: Self-pay

## 2020-09-22 ENCOUNTER — Encounter: Payer: Self-pay | Admitting: Emergency Medicine

## 2020-09-22 DIAGNOSIS — Z79899 Other long term (current) drug therapy: Secondary | ICD-10-CM | POA: Insufficient documentation

## 2020-09-22 DIAGNOSIS — E119 Type 2 diabetes mellitus without complications: Secondary | ICD-10-CM | POA: Diagnosis not present

## 2020-09-22 DIAGNOSIS — I10 Essential (primary) hypertension: Secondary | ICD-10-CM | POA: Insufficient documentation

## 2020-09-22 MED ORDER — AMLODIPINE BESYLATE 2.5 MG PO TABS: 2.5000 mg | ORAL_TABLET | Freq: Every day | ORAL | 0 refills | Status: AC

## 2020-09-22 NOTE — ED Notes (Signed)
Pt to room in wheelchair. Pt advised her BP was high this morning. Pt took her medication as prescribed today. Pt denies any other symptoms. Pt last saw her PCP in January.

## 2020-09-22 NOTE — ED Provider Notes (Signed)
[facility] Emergency Department Provider Note ____________________________________________  Time seen: 1440  I have reviewed the triage vital signs and the nursing notes.  HISTORY  Chief Complaint  Hypertension   HPI Jasmin Jones is a 87 y.o. female presents to the ER today with complaint of elevated blood pressures.  She reports her blood pressure this morning was 187/95.  She reports feeling some pressure in her head but denies dizziness or visual changes.  She denies chest pain, chest tightness or shortness of breath.  She reports she intermittently has swelling around her ankles but reports this has not been bad today.  She is taking Losartan as prescribed.  Past Medical History:  Diagnosis Date   Diabetes mellitus without complication (HCC)    Hyperlipidemia    Osteoarthritis    Osteoporosis    Thyroid disease     Patient Active Problem List   Diagnosis Date Noted   Leg pain 10/23/2019   PAD (peripheral artery disease) (HCC) 10/23/2019   Chronic venous insufficiency 10/23/2019   Essential hypertension 10/17/2019   Situational anxiety 08/18/2019   B12 deficiency 02/10/2019   Chronic bilateral low back pain without sciatica 12/20/2018   Chronic right hip pain 09/08/2018   Stiffness of hip joint 04/14/2018   Chronic thumb pain, left 04/15/2017   DNR (do not resuscitate) 09/24/2016   Medicare annual wellness visit, subsequent 09/24/2016   Osteopenia of multiple sites 05/04/2016   Bilateral shoulder pain 04/20/2016   Primary osteoarthritis of both knees 12/25/2015   Localized osteoarthritis of left knee 10/29/2015   History of chronic health problem 09/26/2015   Chronic pain in right shoulder 08/27/2015   Chronic pain of left knee 08/27/2015   Borderline diabetes 02/08/2014   Obesity (BMI 35.0-39.9 without comorbidity) 02/08/2014   Pure hypercholesterolemia 02/08/2014   Baker's cyst of knee 09/18/2013   PMR (polymyalgia rheumatica) (HCC)  06/26/2013   Unspecified osteoarthritis, unspecified site 06/26/2013    Past Surgical History:  Procedure Laterality Date   ABDOMINAL HYSTERECTOMY     ABDOMINAL HYSTERECTOMY     COLONOSCOPY  03/25/2007    Prior to Admission medications   Medication Sig Start Date End Date Taking? Authorizing Provider  amLODipine (NORVASC) 2.5 MG tablet Take 1 tablet (2.5 mg total) by mouth daily. 09/22/20 09/22/21 Yes Agostino Gorin, Salvadore Oxford, NP  acetaminophen (TYLENOL) 500 MG tablet Take by mouth. 12/20/18   [provider]  albuterol (VENTOLIN HFA) 108 (90 Base) MCG/ACT inhaler Inhale into the lungs. 05/12/19   [provider]  atorvastatin (LIPITOR) 20 MG tablet Take by mouth. 08/02/19   [provider]  Calcium Carbonate-Vitamin D 600-200 MG-UNIT TABS Take 2 tablets by mouth daily.    [provider]  cyanocobalamin 1000 MCG tablet Take by mouth.    [provider]  escitalopram (LEXAPRO) 10 MG tablet Take 10 mg by mouth daily.    [provider]  escitalopram (LEXAPRO) 5 MG tablet Take by mouth. 09/11/19 12/10/19  [provider]  fluticasone (FLONASE) 50 MCG/ACT nasal spray SPRAY 2 SPRAYS INTO EACH NOSTRIL EVERY DAY 08/03/19   [provider]  hydrochlorothiazide (HYDRODIURIL) 12.5 MG tablet Take 1 tablet by mouth daily. 05/30/19   [provider]  levocetirizine (XYZAL) 5 MG tablet every evening. 04/28/19   [provider]  losartan (COZAAR) 50 MG tablet Take by mouth. 07/12/19 07/11/20  [provider]  ofloxacin (OCUFLOX) 0.3 % ophthalmic solution Place 1 drop into the right eye 4 (four) times daily.  09/30/19   [provider]    Allergies Lisinopril  Family History  Problem Relation Age of Onset   Heart disease Mother    Hypertension Father    Diabetes Brother    Heart attack Brother    Cancer Maternal Uncle     Social History Social History   Tobacco Use   Smoking status: Never   Smokeless  tobacco: Never    Review of Systems  Constitutional: Negative for fever, chills or body aches. Eyes: Negative for visual changes. Cardiovascular: Negative for chest pain or chest tightness. Respiratory: Negative for shortness of breath. Neurological: Negative for headaches, focal weakness, tingling or numbness. ____________________________________________  PHYSICAL EXAM:  VITAL SIGNS: ED Triage Vitals  Enc Vitals Group     BP 09/22/20 1314 (!) 183/91     Pulse Rate 09/22/20 1314 78     Resp 09/22/20 1314 20     Temp 09/22/20 1314 98.6 F (37 C)     Temp Source 09/22/20 1314 Oral     SpO2 09/22/20 1314 97 %     Weight 09/22/20 1313 180 lb (81.6 kg)     Height 09/22/20 1313 4\' 11"  (1.499 m)     Head Circumference --      Peak Flow --      Pain Score 09/22/20 1313 0     Pain Loc --      Pain Edu? --      Excl. in GC? --     Constitutional: Alert and oriented. Well appearing and in no distress. Head: Normocephalic Eyes: Normal extraocular movements Cardiovascular: Normal rate, regular rhythm. Respiratory: Normal respiratory effort. No wheezes/rales/rhonchi. Neurologic:  Normal speech and language. No gross focal neurologic deficits are appreciated. _____________________________________________  INITIAL IMPRESSION / ASSESSMENT AND PLAN / ED COURSE  HTN:  Repeat BP 167/87 Continue Losartan Will add Amlodipine 2.5 mg daily Reinforced DASH diet Will have her follow up with PCP in 1 week for reevaluation ____________________________________________  FINAL CLINICAL IMPRESSION(S) / ED DIAGNOSES  Final diagnoses:  Primary hypertension      09/24/20, NP 09/22/20 1508    09/24/20, MD 09/22/20 1528

## 2020-09-22 NOTE — Discharge Instructions (Addendum)
You were seen today for elevated blood pressure.  You should continue losartan.  I have sent in amlodipine 2.5 mg for you to take in addition to your losartan.  Please follow-up with your PCP for further evaluation

## 2020-09-22 NOTE — ED Triage Notes (Signed)
Pt reports her BP was high this am. Pt denies any other symptom. Pt denies pain, SOB, HA and all other complaints just concerned about BP

## 2020-10-03 ENCOUNTER — Emergency Department
Admission: EM | Admit: 2020-10-03 | Discharge: 2020-10-03 | Disposition: A | Discharge status: Home / Self Care | Payer: Medicare Other | Source: Home / Self Care | Attending: Emergency Medicine | Admitting: Emergency Medicine

## 2020-10-03 ENCOUNTER — Other Ambulatory Visit: Payer: Self-pay

## 2020-10-03 ENCOUNTER — Encounter: Payer: Self-pay | Admitting: Emergency Medicine

## 2020-10-03 ENCOUNTER — Emergency Department: Payer: Medicare Other

## 2020-10-03 ENCOUNTER — Emergency Department
Admission: EM | Admit: 2020-10-03 | Discharge: 2020-10-03 | Disposition: A | Discharge status: Home / Self Care | Payer: Medicare Other | Attending: Emergency Medicine | Admitting: Emergency Medicine

## 2020-10-03 DIAGNOSIS — Z79899 Other long term (current) drug therapy: Secondary | ICD-10-CM | POA: Insufficient documentation

## 2020-10-03 DIAGNOSIS — E119 Type 2 diabetes mellitus without complications: Secondary | ICD-10-CM | POA: Insufficient documentation

## 2020-10-03 DIAGNOSIS — R519 Headache, unspecified: Secondary | ICD-10-CM | POA: Insufficient documentation

## 2020-10-03 DIAGNOSIS — I10 Essential (primary) hypertension: Secondary | ICD-10-CM

## 2020-10-03 LAB — COMPREHENSIVE METABOLIC PANEL
ALT: 16 U/L (ref 0–44)
AST: 17 U/L (ref 15–41)
Albumin: 3.9 g/dL (ref 3.5–5.0)
Alkaline Phosphatase: 81 U/L (ref 38–126)
Anion gap: 9 (ref 5–15)
BUN: 8 mg/dL (ref 8–23)
CO2: 30 mmol/L (ref 22–32)
Calcium: 9.3 mg/dL (ref 8.9–10.3)
Chloride: 100 mmol/L (ref 98–111)
Creatinine, Ser: 0.61 mg/dL (ref 0.44–1.00)
GFR, Estimated: >60 mL/min (ref >60)
Glucose, Bld: 109 mg/dL — ABNORMAL HIGH (ref 70–99)
Potassium: 4.5 mmol/L (ref 3.5–5.1)
Sodium: 139 mmol/L (ref 135–145)
Total Bilirubin: 1 mg/dL (ref 0.3–1.2)
Total Protein: 7.2 g/dL (ref 6.5–8.1)

## 2020-10-03 LAB — CBC
HCT: 39.4 % (ref 36.0–46.0)
Hemoglobin: 12.9 g/dL (ref 12.0–15.0)
MCH: 30.8 pg (ref 26.0–34.0)
MCHC: 32.7 g/dL (ref 30.0–36.0)
MCV: 94 fL (ref 80.0–100.0)
Platelets: 242 10*3/uL (ref 150–400)
RBC: 4.19 MIL/uL (ref 3.87–5.11)
RDW: 13.2 % (ref 11.5–15.5)
WBC: 10.1 10*3/uL (ref 4.0–10.5)
nRBC: 0 % (ref 0.0–0.2)

## 2020-10-03 NOTE — Discharge Instructions (Addendum)

## 2020-10-03 NOTE — ED Notes (Signed)
See triage note  Presents with htn  States she was seen last pm told that she needed to be rechecked this am

## 2020-10-03 NOTE — ED Provider Notes (Signed)
[facility] Emergency Department Provider Note  ____________________________________________   Event Date/Time   First MD Initiated Contact with Patient 10/03/20 [phone]     (approximate)  I have reviewed the triage vital signs and the nursing notes.   HISTORY  Chief Complaint Hypertension    HPI Jasmin Jones is a 87 y.o. female who presents for evaluation of her blood.  She said that she was concerned because she tried to get in touch with a friend tonight and found out that the friend was in the emergency department.  She was worried about her friend and she says that she thinks that made her anxious and upset and made her blood pressure go up.  She is treated for hypertension by her primary care doctor just recently was switched to a new medication that seems to be doing well, but when she checked her blood pressure at home and that was elevated in the range of 180/100.  At that point she felt a little bit of a slight headache but she and her daughter felt like she should be checked out just to be sure.  Since she has been here she said that her headache is resolved and she feels much better.  Her blood pressure has come down to about 163/85.  She has no additional symptoms.  She denies nausea, vomiting, visual changes, chest pain, shortness of breath, abdominal pain, and recent dysuria.  Nothing particular made her symptoms better or worse except for feeling more comfortable when she got here.     Past Medical History:  Diagnosis Date   Diabetes mellitus without complication (HCC)    Hyperlipidemia    Osteoarthritis    Osteoporosis    Thyroid disease     Patient Active Problem List   Diagnosis Date Noted   Leg pain 10/23/2019   PAD (peripheral artery disease) (HCC) 10/23/2019   Chronic venous insufficiency 10/23/2019   Essential hypertension 10/17/2019   Situational anxiety 08/18/2019   B12 deficiency 02/10/2019   Chronic bilateral low back pain  without sciatica 12/20/2018   Chronic right hip pain 09/08/2018   Stiffness of hip joint 04/14/2018   Chronic thumb pain, left 04/15/2017   DNR (do not resuscitate) 09/24/2016   Medicare annual wellness visit, subsequent 09/24/2016   Osteopenia of multiple sites 05/04/2016   Bilateral shoulder pain 04/20/2016   Primary osteoarthritis of both knees 12/25/2015   Localized osteoarthritis of left knee 10/29/2015   History of chronic health problem 09/26/2015   Chronic pain in right shoulder 08/27/2015   Chronic pain of left knee 08/27/2015   Borderline diabetes 02/08/2014   Obesity (BMI 35.0-39.9 without comorbidity) 02/08/2014   Pure hypercholesterolemia 02/08/2014   Baker's cyst of knee 09/18/2013   PMR (polymyalgia rheumatica) (HCC) 06/26/2013   Unspecified osteoarthritis, unspecified site 06/26/2013    Past Surgical History:  Procedure Laterality Date   ABDOMINAL HYSTERECTOMY     ABDOMINAL HYSTERECTOMY     COLONOSCOPY  03/25/2007    Prior to Admission medications   Medication Sig Start Date End Date Taking? Authorizing Provider  acetaminophen (TYLENOL) 500 MG tablet Take by mouth. 12/20/18   [provider]  albuterol (VENTOLIN HFA) 108 (90 Base) MCG/ACT inhaler Inhale into the lungs. 05/12/19   [provider]  amLODipine (NORVASC) 2.5 MG tablet Take 1 tablet (2.5 mg total) by mouth daily. 09/22/20 09/22/21  Lorre Munroe, NP  atorvastatin (LIPITOR) 20 MG tablet Take by mouth. 08/02/19   [provider]  Calcium Carbonate-Vitamin D 600-200 MG-UNIT TABS Take 2 tablets by mouth daily.    [provider]  cyanocobalamin 1000 MCG tablet Take by mouth.    [provider]  escitalopram (LEXAPRO) 10 MG tablet Take 10 mg by mouth daily.    [provider]  escitalopram (LEXAPRO) 5 MG tablet Take by mouth. 09/11/19 12/10/19  [provider]  fluticasone (FLONASE) 50 MCG/ACT nasal spray SPRAY 2 SPRAYS INTO EACH NOSTRIL EVERY DAY  08/03/19   [provider]  hydrochlorothiazide (HYDRODIURIL) 12.5 MG tablet Take 1 tablet by mouth daily. 05/30/19   [provider]  levocetirizine (XYZAL) 5 MG tablet every evening. 04/28/19   [provider]  losartan (COZAAR) 50 MG tablet Take by mouth. 07/12/19 07/11/20  [provider]  ofloxacin (OCUFLOX) 0.3 % ophthalmic solution Place 1 drop into the right eye 4 (four) times daily. 09/30/19   [provider]    Allergies Lisinopril  Family History  Problem Relation Age of Onset   Heart disease Mother    Hypertension Father    Diabetes Brother    Heart attack Brother    Cancer Maternal Uncle     Social History Social History   Tobacco Use   Smoking status: Never   Smokeless tobacco: Never    Review of Systems Constitutional: Positive for hypertension.  No fever/chills Eyes: No visual changes. Cardiovascular: Denies chest pain. Respiratory: Denies shortness of breath. Gastrointestinal: No abdominal pain.  No nausea, no vomiting.  Genitourinary: Negative for dysuria. Neurological: Initially with mild headache, now resolved.  No focal numbness nor weakness.   ____________________________________________   PHYSICAL EXAM:  VITAL SIGNS: ED Triage Vitals  Enc Vitals Group     BP 10/03/20 0154 (!) 163/85     Pulse Rate 10/03/20 0154 77     Resp 10/03/20 0154 18     Temp 10/03/20 0154 98.3 F (36.8 C)     Temp Source 10/03/20 0154 Oral     SpO2 10/03/20 0154 98 %     Weight 10/03/20 0153 81.6 kg (180 lb)     Height 10/03/20 0153 1.499 m (4\' 11" )     Head Circumference --      Peak Flow --      Pain Score 10/03/20 0153 0     Pain Loc --      Pain Edu? --      Excl. in GC? --     Constitutional: Alert and oriented.  Eyes: Conjunctivae are normal.  Head: Atraumatic. Cardiovascular: Normal rate, regular rhythm. Good peripheral circulation. Respiratory: Normal respiratory effort.  No retractions. Gastrointestinal: Soft  and nondistended. Musculoskeletal: No gross deformities of extremities. Neurologic:  Normal speech and language. No gross focal neurologic deficits are appreciated.  Skin:  Skin is warm, dry and intact. Psychiatric: Mood and affect are normal. Speech and behavior are normal.  ____________________________________________    INITIAL IMPRESSION / MDM / ASSESSMENT AND PLAN / ED COURSE  As part of my medical decision making, I reviewed the following data within the electronic MEDICAL RECORD NUMBER History obtained from family, Nursing notes reviewed and incorporated, and Old chart reviewed  Patient is well-appearing and in no distress.  She appears considerably younger than her chronological age and her mind is sharp.  She is in no distress and her blood pressure has come down from the initial recording she had at home.  She says that she thinks she just got a little bit anxious with her friend being  in the emergency department.  She has no additional symptoms that would prompt additional work-up.  I also spoke with her daughter who is here with her, and I had my usual and customary asymptomatic hypertension discussion with them and they are in agreement.  they are both comfortable with the plan for discharge and outpatient follow-up without further intervention.  I gave my usual and customary return precautions.  No indication of emergent medical condition at this time.  ____________________________________________  FINAL CLINICAL IMPRESSION(S) / ED DIAGNOSES  Final diagnoses:  Essential hypertension     MEDICATIONS GIVEN DURING THIS VISIT:  Medications - No data to display   ED Discharge Orders     None        Note:  This document was prepared using Dragon voice recognition software and may include unintentional dictation errors.   Loleta Rose, MD 10/03/20 [phone]

## 2020-10-03 NOTE — ED Provider Notes (Signed)
[facility] Emergency Department Provider Note  Time seen: 12:36 PM  I have reviewed the triage vital signs and the nursing notes.   HISTORY  Chief Complaint Headache and Hypertension   HPI Jasmin Jones is a 87 y.o. female with a past medical history of diabetes, hyperlipidemia, hypertension, presents emergency department for high blood pressure and headache.  According to the patient for the past week or so she has had elevations in her blood pressure as high as 170 180 systolic at home.  Patient was here last night however her blood pressure did come down when she got here and she was ultimately discharged home.  Patient states she again developed a headache this morning so she checked her blood pressure and it was 170 and she called her daughter.  Daughter advised her to wait a little bit and check it again it went up higher so they came to the emergency department for evaluation.  Patient states that headache is gone patient's blood pressure upon arrival is 124/76.  Patient states he has an upper arm cuff at home not a wrist cuff.   Past Medical History:  Diagnosis Date   Diabetes mellitus without complication (HCC)    Hyperlipidemia    Osteoarthritis    Osteoporosis    Thyroid disease     Patient Active Problem List   Diagnosis Date Noted   Leg pain 10/23/2019   PAD (peripheral artery disease) (HCC) 10/23/2019   Chronic venous insufficiency 10/23/2019   Essential hypertension 10/17/2019   Situational anxiety 08/18/2019   B12 deficiency 02/10/2019   Chronic bilateral low back pain without sciatica 12/20/2018   Chronic right hip pain 09/08/2018   Stiffness of hip joint 04/14/2018   Chronic thumb pain, left 04/15/2017   DNR (do not resuscitate) 09/24/2016   Medicare annual wellness visit, subsequent 09/24/2016   Osteopenia of multiple sites 05/04/2016   Bilateral shoulder pain 04/20/2016   Primary osteoarthritis of both knees 12/25/2015   Localized  osteoarthritis of left knee 10/29/2015   History of chronic health problem 09/26/2015   Chronic pain in right shoulder 08/27/2015   Chronic pain of left knee 08/27/2015   Borderline diabetes 02/08/2014   Obesity (BMI 35.0-39.9 without comorbidity) 02/08/2014   Pure hypercholesterolemia 02/08/2014   Baker's cyst of knee 09/18/2013   PMR (polymyalgia rheumatica) (HCC) 06/26/2013   Unspecified osteoarthritis, unspecified site 06/26/2013    Past Surgical History:  Procedure Laterality Date   ABDOMINAL HYSTERECTOMY     ABDOMINAL HYSTERECTOMY     COLONOSCOPY  03/25/2007    Prior to Admission medications   Medication Sig Start Date End Date Taking? Authorizing Provider  acetaminophen (TYLENOL) 500 MG tablet Take by mouth. 12/20/18   [provider]  albuterol (VENTOLIN HFA) 108 (90 Base) MCG/ACT inhaler Inhale into the lungs. 05/12/19   [provider]  amLODipine (NORVASC) 2.5 MG tablet Take 1 tablet (2.5 mg total) by mouth daily. 09/22/20 09/22/21  Lorre Munroe, NP  atorvastatin (LIPITOR) 20 MG tablet Take by mouth. 08/02/19   [provider]  Calcium Carbonate-Vitamin D 600-200 MG-UNIT TABS Take 2 tablets by mouth daily.    [provider]  cyanocobalamin 1000 MCG tablet Take by mouth.    [provider]  escitalopram (LEXAPRO) 10 MG tablet Take 10 mg by mouth daily.    [provider]  escitalopram (LEXAPRO) 5 MG tablet Take by mouth. 09/11/19 12/10/19  [provider]  fluticasone (FLONASE) 50 MCG/ACT nasal spray  SPRAY 2 SPRAYS INTO EACH NOSTRIL EVERY DAY 08/03/19   [provider]  hydrochlorothiazide (HYDRODIURIL) 12.5 MG tablet Take 1 tablet by mouth daily. 05/30/19   [provider]  levocetirizine (XYZAL) 5 MG tablet every evening. 04/28/19   [provider]  losartan (COZAAR) 50 MG tablet Take by mouth. 07/12/19 07/11/20  [provider]  ofloxacin (OCUFLOX) 0.3 % ophthalmic solution Place 1 drop  into the right eye 4 (four) times daily. 09/30/19   [provider]    Allergies  Allergen Reactions   Lisinopril Cough    Family History  Problem Relation Age of Onset   Heart disease Mother    Hypertension Father    Diabetes Brother    Heart attack Brother    Cancer Maternal Uncle     Social History Social History   Tobacco Use   Smoking status: Never   Smokeless tobacco: Never    Review of Systems Constitutional: Negative for fever. Cardiovascular: Negative for chest pain. Respiratory: Negative for shortness of breath. Gastrointestinal: Negative for abdominal pain Musculoskeletal: Negative for musculoskeletal complaints Neurological: Intermittent headache when her blood pressure goes up, none currently All other ROS negative  ____________________________________________   PHYSICAL EXAM:  VITAL SIGNS: ED Triage Vitals  Enc Vitals Group     BP 10/03/20 1109 124/76     Pulse Rate 10/03/20 1109 98     Resp 10/03/20 1109 18     Temp 10/03/20 1109 98.1 F (36.7 C)     Temp Source 10/03/20 1109 Oral     SpO2 10/03/20 1109 96 %     Weight 10/03/20 1110 180 lb (81.6 kg)     Height 10/03/20 1110 4\' 11"  (1.499 m)     Head Circumference --      Peak Flow --      Pain Score 10/03/20 1110 4     Pain Loc --      Pain Edu? --      Excl. in GC? --    Constitutional: Alert and oriented. Well appearing and in no distress. Eyes: Normal exam ENT      Head: Normocephalic and atraumatic.      Mouth/Throat: Mucous membranes are moist. Cardiovascular: Normal rate, regular rhythm. Respiratory: Normal respiratory effort without tachypnea nor retractions. Breath sounds are clear  Gastrointestinal: Soft and nontender. No distention Musculoskeletal: Nontender with normal range of motion in all extremities. Neurologic:  Normal speech and language. No gross focal neurologic deficits.  Equal grip strength.  No pronator drift. Skin:  Skin is warm, dry and intact.   Psychiatric: Mood and affect are normal.   ____________________________________________     RADIOLOGY  CT scan head shows no acute abnormality.  ____________________________________________   INITIAL IMPRESSION / ASSESSMENT AND PLAN / ED COURSE  Pertinent labs & imaging results that were available during my care of the patient were reviewed by me and considered in my medical decision making (see chart for details).   Patient presents emergency department for high blood pressure and headache.  This is the second anaerobe patient is noting emergency department for the same.  As a precaution we will check labs including a CBC CMP and obtain a CT scan of the head given her headache with high blood pressure.  Pressure has normalized without intervention currently 124/76.  We will continue to closely monitor awaiting results.  Patient and daughter agreeable plan of care.  Lab work is largely within normal limits.  Normal creatinine.  CT scan  normal.  Patient will be discharged home with PCP follow-up.  Patient agreeable to plan of care.  Jasmin Jones was evaluated in Emergency Department on 10/03/2020 for the symptoms described in the history of present illness. She was evaluated in the context of the global COVID-19 pandemic, which necessitated consideration that the patient might be at risk for infection with the SARS-CoV-2 virus that causes COVID-19. Institutional protocols and algorithms that pertain to the evaluation of patients at risk for COVID-19 are in a state of rapid change based on information released by regulatory bodies including the CDC and federal and state organizations. These policies and algorithms were followed during the patient's care in the ED.  ____________________________________________   FINAL CLINICAL IMPRESSION(S) / ED DIAGNOSES  Hypertension Headache   Minna Antis, MD 10/03/20 [phone]

## 2020-10-03 NOTE — ED Triage Notes (Signed)
Pt took her BP tonight and got 181/101, pt had slight headache. Pt denies chest pain, or dizziness.

## 2020-10-03 NOTE — ED Triage Notes (Signed)
Pt reports that pt was here last night for HTN, she was discharged, comes in today with HTN and a headache. She cant get in to PMD until tomorrow.

## 2020-10-03 NOTE — ED Notes (Signed)
Pt assessed by provider prior to d/c.  

## 2020-10-15 ENCOUNTER — Other Ambulatory Visit: Payer: Self-pay | Admitting: Internal Medicine

## 2020-10-15 NOTE — Telephone Encounter (Signed)
  Notes to clinic:  Patient has not been seen  Review for refill    Requested Prescriptions  Pending Prescriptions Disp Refills   amLODipine (NORVASC) 2.5 MG tablet [Pharmacy Med Name: AMLODIPINE BESYLATE 2.5 MG TAB] 30 tablet 0    Sig: TAKE 1 TABLET BY MOUTH EVERY DAY      Cardiovascular:  Calcium Channel Blockers Failed - 10/15/2020 12:33 PM      Failed - Valid encounter within last 6 months    Recent Outpatient Visits   None              Passed - Last BP in normal range    BP Readings from Last 1 Encounters:  10/03/20 120/70

## 2020-11-10 NOTE — Telephone Encounter (Signed)
This is not my patient.  Did you intend to send this to Dr. Marisue Ivan?

## 2020-11-17 ENCOUNTER — Other Ambulatory Visit: Payer: Self-pay | Admitting: Internal Medicine

## 2020-11-17 NOTE — Telephone Encounter (Signed)
[facility] ED provider was Nicki Reaper NP

## 2020-11-23 ENCOUNTER — Other Ambulatory Visit: Payer: Self-pay | Admitting: Internal Medicine

## 2020-11-23 NOTE — Telephone Encounter (Signed)
This pt's PCP is with [facility] Dr Marisue Ivan

## 2020-12-07 ENCOUNTER — Emergency Department: Payer: Medicare Other

## 2020-12-07 ENCOUNTER — Emergency Department
Admission: EM | Admit: 2020-12-07 | Discharge: 2020-12-07 | Disposition: A | Discharge status: Home / Self Care | Payer: Medicare Other | Attending: Student in an Organized Health Care Education/Training Program | Admitting: Student in an Organized Health Care Education/Training Program

## 2020-12-07 ENCOUNTER — Other Ambulatory Visit: Payer: Self-pay

## 2020-12-07 DIAGNOSIS — M25561 Pain in right knee: Secondary | ICD-10-CM | POA: Insufficient documentation

## 2020-12-07 DIAGNOSIS — W01198A Fall on same level from slipping, tripping and stumbling with subsequent striking against other object, initial encounter: Secondary | ICD-10-CM | POA: Insufficient documentation

## 2020-12-07 DIAGNOSIS — R519 Headache, unspecified: Secondary | ICD-10-CM | POA: Diagnosis not present

## 2020-12-07 DIAGNOSIS — E119 Type 2 diabetes mellitus without complications: Secondary | ICD-10-CM | POA: Diagnosis not present

## 2020-12-07 DIAGNOSIS — Z79899 Other long term (current) drug therapy: Secondary | ICD-10-CM | POA: Diagnosis not present

## 2020-12-07 DIAGNOSIS — I10 Essential (primary) hypertension: Secondary | ICD-10-CM | POA: Insufficient documentation

## 2020-12-07 DIAGNOSIS — M25562 Pain in left knee: Secondary | ICD-10-CM | POA: Insufficient documentation

## 2020-12-07 DIAGNOSIS — W19XXXA Unspecified fall, initial encounter: Secondary | ICD-10-CM

## 2020-12-07 MED ORDER — ONDANSETRON 4 MG PO TBDP
4.0000 mg | ORAL_TABLET | Freq: Once | ORAL | Status: AC
  Administered 2020-12-07: 4 mg via ORAL
  Filled 2020-12-07: qty 1

## 2020-12-07 MED ORDER — HYDROCODONE-ACETAMINOPHEN 5-325 MG PO TABS
1.0000 | ORAL_TABLET | Freq: Once | ORAL | Status: AC
Start: 2020-12-07 — End: 2020-12-07
  Administered 2020-12-07: 1 via ORAL
  Filled 2020-12-07: qty 1

## 2020-12-07 NOTE — Discharge Instructions (Addendum)
Please keep follow-up appointment with orthopedics on Tuesday. You can continue taking Tylenol at home for pain.

## 2020-12-07 NOTE — ED Provider Notes (Signed)
[facility]-EMERGENCY DEPARTMENT  ____________________________________________  Time seen: Approximately 9:55 PM  I have reviewed the triage vital signs and the nursing notes.   HISTORY  Chief Complaint Fall   Historian Patient     HPI Jasmin Jones is a 87 y.o. female presents to the emergency department after a mechanical fall.  Patient is complaining of bilateral knee pain and headache.  Patient tripped over her bedroom slippers and hit the wall.  She did not lose consciousness.  Denies blurry vision, dizziness, chest pain, chest tightness or abdominal pain.  No lacerations.  Patient does not currently take any blood thinners.  Patient is scheduled to have cortisone injections in the bilateral knees this week as she has history of arthritis.   Past Medical History:  Diagnosis Date   Diabetes mellitus without complication (HCC)    Hyperlipidemia    Osteoarthritis    Osteoporosis    Thyroid disease      Immunizations up to date:  Yes.     Past Medical History:  Diagnosis Date   Diabetes mellitus without complication (HCC)    Hyperlipidemia    Osteoarthritis    Osteoporosis    Thyroid disease     Patient Active Problem List   Diagnosis Date Noted   Leg pain 10/23/2019   PAD (peripheral artery disease) (HCC) 10/23/2019   Chronic venous insufficiency 10/23/2019   Essential hypertension 10/17/2019   Situational anxiety 08/18/2019   B12 deficiency 02/10/2019   Chronic bilateral low back pain without sciatica 12/20/2018   Chronic right hip pain 09/08/2018   Stiffness of hip joint 04/14/2018   Chronic thumb pain, left 04/15/2017   DNR (do not resuscitate) 09/24/2016   Medicare annual wellness visit, subsequent 09/24/2016   Osteopenia of multiple sites 05/04/2016   Bilateral shoulder pain 04/20/2016   Primary osteoarthritis of both knees 12/25/2015   Localized osteoarthritis of left knee 10/29/2015   History of chronic health problem 09/26/2015   Chronic pain in  right shoulder 08/27/2015   Chronic pain of left knee 08/27/2015   Borderline diabetes 02/08/2014   Obesity (BMI 35.0-39.9 without comorbidity) 02/08/2014   Pure hypercholesterolemia 02/08/2014   Baker's cyst of knee 09/18/2013   PMR (polymyalgia rheumatica) (HCC) 06/26/2013   Unspecified osteoarthritis, unspecified site 06/26/2013    Past Surgical History:  Procedure Laterality Date   ABDOMINAL HYSTERECTOMY     ABDOMINAL HYSTERECTOMY     COLONOSCOPY  03/25/2007    Prior to Admission medications   Medication Sig Start Date End Date Taking? Authorizing Provider  acetaminophen (TYLENOL) 500 MG tablet Take by mouth. 12/20/18   [provider]  albuterol (VENTOLIN HFA) 108 (90 Base) MCG/ACT inhaler Inhale into the lungs. 05/12/19   [provider]  amLODipine (NORVASC) 2.5 MG tablet Take 1 tablet (2.5 mg total) by mouth daily. 09/22/20 09/22/21  Lorre MunroeBaity, Regina W, NP  atorvastatin (LIPITOR) 20 MG tablet Take by mouth. 08/02/19   [provider]  Calcium Carbonate-Vitamin D 600-200 MG-UNIT TABS Take 2 tablets by mouth daily.    [provider]  cyanocobalamin 1000 MCG tablet Take by mouth.    [provider]  escitalopram (LEXAPRO) 10 MG tablet Take 10 mg by mouth daily.    [provider]  escitalopram (LEXAPRO) 5 MG tablet Take by mouth. 09/11/19 12/10/19  [provider]  fluticasone (FLONASE) 50 MCG/ACT nasal spray SPRAY 2 SPRAYS INTO EACH NOSTRIL EVERY DAY 08/03/19   [provider]  hydrochlorothiazide (HYDRODIURIL) 12.5 MG tablet Take 1  tablet by mouth daily. 05/30/19   [provider]  levocetirizine (XYZAL) 5 MG tablet every evening. 04/28/19   [provider]  losartan (COZAAR) 50 MG tablet Take by mouth. 07/12/19 07/11/20  [provider]  ofloxacin (OCUFLOX) 0.3 % ophthalmic solution Place 1 drop into the right eye 4 (four) times daily. 09/30/19   [provider]     Allergies Lisinopril  Family History  Problem Relation Age of Onset   Heart disease Mother    Hypertension Father    Diabetes Brother    Heart attack Brother    Cancer Maternal Uncle     Social History Social History   Tobacco Use   Smoking status: Never   Smokeless tobacco: Never     Review of Systems  Constitutional: No fever/chills Eyes:  No discharge ENT: No upper respiratory complaints. Respiratory: no cough. No SOB/ use of accessory muscles to breath Gastrointestinal:   No nausea, no vomiting.  No diarrhea.  No constipation. Musculoskeletal: Patient has bilateral knee pain.  Neuro: Patient has headache.  Skin: Negative for rash, abrasions, lacerations, ecchymosis.    ____________________________________________   PHYSICAL EXAM:  VITAL SIGNS: ED Triage Vitals  Enc Vitals Group     BP 12/07/20 1840 133/69     Pulse Rate 12/07/20 1840 83     Resp 12/07/20 1840 18     Temp 12/07/20 1840 98.6 F (37 C)     Temp Source 12/07/20 1840 Oral     SpO2 12/07/20 1840 98 %     Weight 12/07/20 1841 180 lb (81.6 kg)     Height 12/07/20 1841 4\' 11"  (1.499 m)     Head Circumference --      Peak Flow --      Pain Score 12/07/20 1841 4     Pain Loc --      Pain Edu? --      Excl. in GC? --      Constitutional: Alert and oriented. Well appearing and in no acute distress. Eyes: Conjunctivae are normal. PERRL. EOMI. Head: Atraumatic. ENT:      Nose: No congestion/rhinnorhea.      Mouth/Throat: Mucous membranes are moist.  Neck: No stridor. FROM.  Cardiovascular: Normal rate, regular rhythm. Normal S1 and S2.  Good peripheral circulation. Respiratory: Normal respiratory effort without tachypnea or retractions. Lungs CTAB. Good air entry to the bases with no decreased or absent breath sounds Gastrointestinal: Bowel sounds x 4 quadrants. Soft and nontender to palpation. No guarding or rigidity. No distention. Musculoskeletal: Full range of motion to all  extremities. No obvious deformities noted Neurologic:  Normal for age. No gross focal neurologic deficits are appreciated.  Skin:  Skin is warm, dry and intact. No rash noted. Psychiatric: Mood and affect are normal for age. Speech and behavior are normal.   ____________________________________________   LABS (all labs ordered are listed, but only abnormal results are displayed)  Labs Reviewed - No data to display ____________________________________________  EKG   ____________________________________________  RADIOLOGY 02/06/21, personally viewed and evaluated these images (plain radiographs) as part of my medical decision making, as well as reviewing the written report by the radiologist.  CT HEAD WO CONTRAST (Geraldo Pitter)  Result Date: 12/07/2020 CLINICAL DATA:  02/06/2021 striking head on the wall after her bedroom slippers slipped out from under her, neck trauma, history diabetes mellitus, hypertension EXAM: CT HEAD WITHOUT CONTRAST CT CERVICAL SPINE WITHOUT CONTRAST TECHNIQUE: Multidetector CT imaging of the head and cervical  spine was performed following the standard protocol without intravenous contrast. Multiplanar CT image reconstructions of the cervical spine were also generated. COMPARISON:  CT head 10/03/2020 FINDINGS: CT HEAD FINDINGS Brain: Generalized atrophy. Normal ventricular morphology. No midline shift or mass effect. Small vessel chronic ischemic changes of deep cerebral white matter. No intracranial hemorrhage, mass lesion, evidence of acute infarction, or extra-axial fluid collection. Vascular: Atherosclerotic calcification of internal carotid arteries bilaterally at skull base. No hyperdense vessels Skull: Small LEFT frontal scalp hematoma.  Calvaria intact Sinuses/Orbits: Opacification of LEFT sphenoid sinus unchanged. Remaining paranasal sinuses and mastoid air cells clear Other: N/A CT CERVICAL SPINE FINDINGS Alignment: Mild anterolisthesis C7-T1 likely due to  degenerative disc and facet disease. Remaining alignments normal Skull base and vertebrae: Osseous demineralization. Skull base intact. Vertebral body heights maintained. Multilevel disc space narrowing and endplate spur formation. Scattered facet degenerative changes bilaterally. No fracture, additional subluxation, or bone destruction. TMJ alignment normal. Soft tissues and spinal canal: Prevertebral soft tissues normal thickness. Atherosclerotic calcification at the carotid bifurcations. Inferior LEFT thyroid mass 3.7 x 2.7 cm see below. Disc levels:  No specific abnormalities Upper chest: Lung apices clear Other: N/A IMPRESSION: Atrophy with small vessel chronic ischemic changes of deep cerebral white matter. No acute intracranial abnormalities. Multilevel degenerative disc and facet disease changes of the cervical spine. No acute cervical spine abnormalities. Inferior LEFT thyroid mass 2.7 x 3.7 cm; in the setting of significant comorbidities or limited life expectancy, no follow-up recommended (ref: J Am Coll Radiol. 2015 Feb;12(2): 143-50). Electronically Signed   By: Ulyses Southward M.D.   On: 12/07/2020 19:42   CT Cervical Spine Wo Contrast  Result Date: 12/07/2020 CLINICAL DATA:  Larey Seat striking head on the wall after her bedroom slippers slipped out from under her, neck trauma, history diabetes mellitus, hypertension EXAM: CT HEAD WITHOUT CONTRAST CT CERVICAL SPINE WITHOUT CONTRAST TECHNIQUE: Multidetector CT imaging of the head and cervical spine was performed following the standard protocol without intravenous contrast. Multiplanar CT image reconstructions of the cervical spine were also generated. COMPARISON:  CT head 10/03/2020 FINDINGS: CT HEAD FINDINGS Brain: Generalized atrophy. Normal ventricular morphology. No midline shift or mass effect. Small vessel chronic ischemic changes of deep cerebral white matter. No intracranial hemorrhage, mass lesion, evidence of acute infarction, or extra-axial fluid  collection. Vascular: Atherosclerotic calcification of internal carotid arteries bilaterally at skull base. No hyperdense vessels Skull: Small LEFT frontal scalp hematoma.  Calvaria intact Sinuses/Orbits: Opacification of LEFT sphenoid sinus unchanged. Remaining paranasal sinuses and mastoid air cells clear Other: N/A CT CERVICAL SPINE FINDINGS Alignment: Mild anterolisthesis C7-T1 likely due to degenerative disc and facet disease. Remaining alignments normal Skull base and vertebrae: Osseous demineralization. Skull base intact. Vertebral body heights maintained. Multilevel disc space narrowing and endplate spur formation. Scattered facet degenerative changes bilaterally. No fracture, additional subluxation, or bone destruction. TMJ alignment normal. Soft tissues and spinal canal: Prevertebral soft tissues normal thickness. Atherosclerotic calcification at the carotid bifurcations. Inferior LEFT thyroid mass 3.7 x 2.7 cm see below. Disc levels:  No specific abnormalities Upper chest: Lung apices clear Other: N/A IMPRESSION: Atrophy with small vessel chronic ischemic changes of deep cerebral white matter. No acute intracranial abnormalities. Multilevel degenerative disc and facet disease changes of the cervical spine. No acute cervical spine abnormalities. Inferior LEFT thyroid mass 2.7 x 3.7 cm; in the setting of significant comorbidities or limited life expectancy, no follow-up recommended (ref: J Am Coll Radiol. 2015 Feb;12(2): 143-50). Electronically Signed   By: Ulyses Southward  M.D.   On: 12/07/2020 19:42   DG Knee Complete 4 Views Left  Result Date: 12/07/2020 CLINICAL DATA:  Fall EXAM: LEFT KNEE - COMPLETE 4+ VIEW COMPARISON:  None. FINDINGS: Moderate to advanced degenerative changes throughout the left knee, most pronounced in the lateral compartment. Near complete joint space loss in the lateral compartment. Spurring diffusely. Trace joint effusion. No acute bony abnormality. Specifically, no fracture,  subluxation, or dislocation. IMPRESSION: Moderate to advanced degenerative changes throughout the left knee. No acute bony abnormality. Electronically Signed   By: Charlett Nose M.D.   On: 12/07/2020 22:20   DG Knee Complete 4 Views Right  Result Date: 12/07/2020 CLINICAL DATA:  Fall EXAM: RIGHT KNEE - COMPLETE 4+ VIEW COMPARISON:  None. FINDINGS: Moderate to advanced degenerative changes in the right knee most pronounced in the lateral compartment with joint space narrowing and spurring. No joint effusion. No acute bony abnormality. Specifically, no fracture, subluxation, or dislocation. Probable intra-articular loose bodies posterior to the knee joint. IMPRESSION: Moderate to advanced degenerative changes with probable intra-articular loose bodies. No acute bony abnormality. Electronically Signed   By: Charlett Nose M.D.   On: 12/07/2020 22:19    ____________________________________________    PROCEDURES  Procedure(s) performed:     Procedures     Medications  HYDROcodone-acetaminophen (NORCO/VICODIN) 5-325 MG per tablet 1 tablet (1 tablet Oral Given 12/07/20 2201)  ondansetron (ZOFRAN-ODT) disintegrating tablet 4 mg (4 mg Oral Given 12/07/20 2201)     ____________________________________________   INITIAL IMPRESSION / ASSESSMENT AND PLAN / ED COURSE  Pertinent labs & imaging results that were available during my care of the patient were reviewed by me and considered in my medical decision making (see chart for details).      Assessment and Plan: Knee pain:  87 year old female presents to the emergency department with bilateral knee pain as well as forehead pain after a fall tonight.  Vital signs are reassuring at triage.  On physical exam, patient was alert, active and nontoxic-appearing with no neurodeficits noted on exam.  CT head showed no evidence of intracranial bleed or skull fracture.  No acute fractures of the bilateral knees.  Patient was given a Vicodin in the emergency  department advised to take Tylenol at home for pain.  She has a follow-up appointment with orthopedics on Tuesday.  Recommended keeping appointment.  All patient questions were answered.   ____________________________________________  FINAL CLINICAL IMPRESSION(S) / ED DIAGNOSES  Final diagnoses:  Fall, initial encounter  Acute pain of both knees      NEW MEDICATIONS STARTED DURING THIS VISIT:  ED Discharge Orders     None           This chart was dictated using voice recognition software/Dragon. Despite best efforts to proofread, errors can occur which can change the meaning. Any change was purely unintentional.     Orvil Feil, PA-C 12/07/20 2244    Willy Eddy, MD 12/08/20 1500

## 2020-12-07 NOTE — ED Triage Notes (Signed)
Pt comes pov after falling and hitting head on wall after her bedroom slippers slipped out from under her. Pt not on any blood thinners. C/o chronic leg pain from arthritis and some increased pain in right leg from fall. Also c/o back of head pain from fall.

## 2023-06-14 IMAGING — CT CT CERVICAL SPINE W/O CM
3 of 4 series · 12 of 35 positions shown, 14 images · non-contrast
Comparison: CT head 10/03/2020

CLINICAL DATA: Fell striking head on the wall after her bedroom
slippers slipped out from under her, neck trauma, history diabetes
mellitus, hypertension

EXAM:
CT HEAD WITHOUT CONTRAST
CT CERVICAL SPINE WITHOUT CONTRAST
TECHNIQUE: Multidetector CT imaging of the head and cervical spine was
performed following the standard protocol without intravenous
contrast. Multiplanar CT image reconstructions of the cervical spine
were also generated.

[Series 4: sagittal bone · sagittal · 0.36mm/px · 5 of 103 slices shown, 6 images]
[im 35/103  bone]
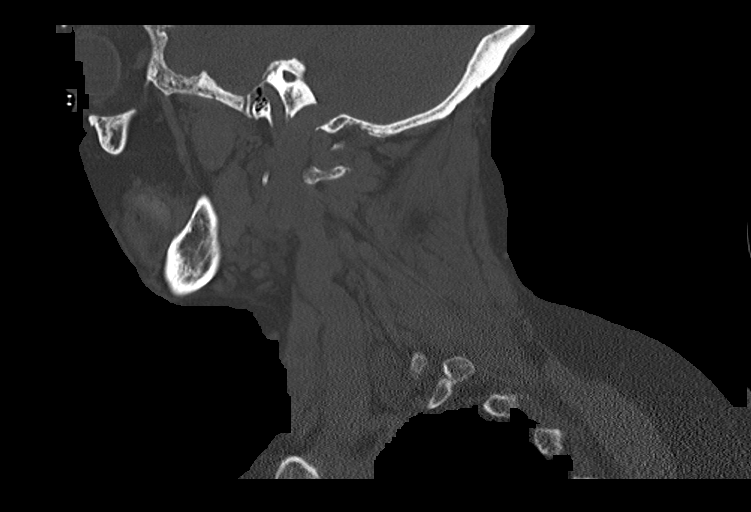
[im 43/103  bone]
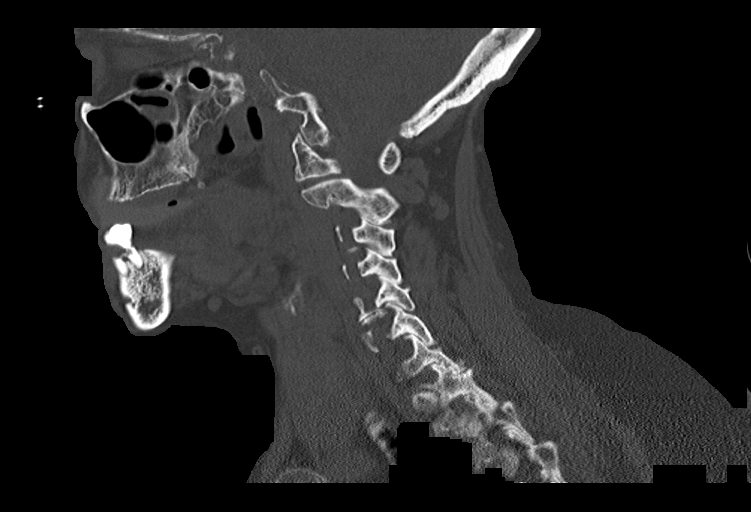
[im 52/103  soft-tissue]
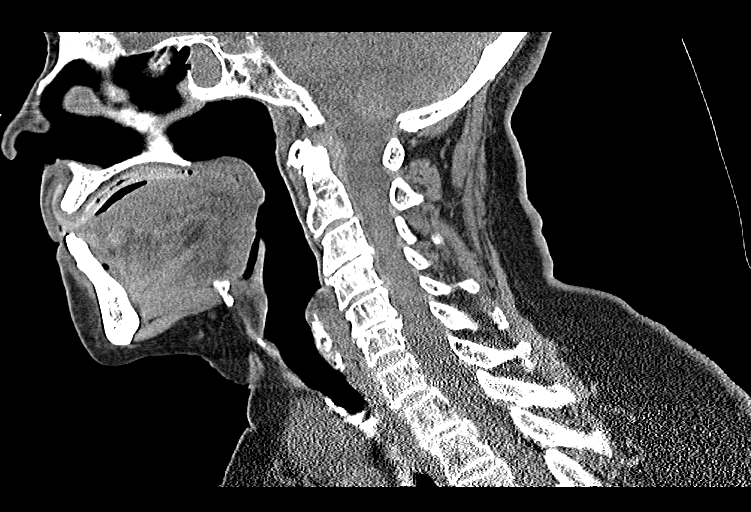
[im 52/103  bone]
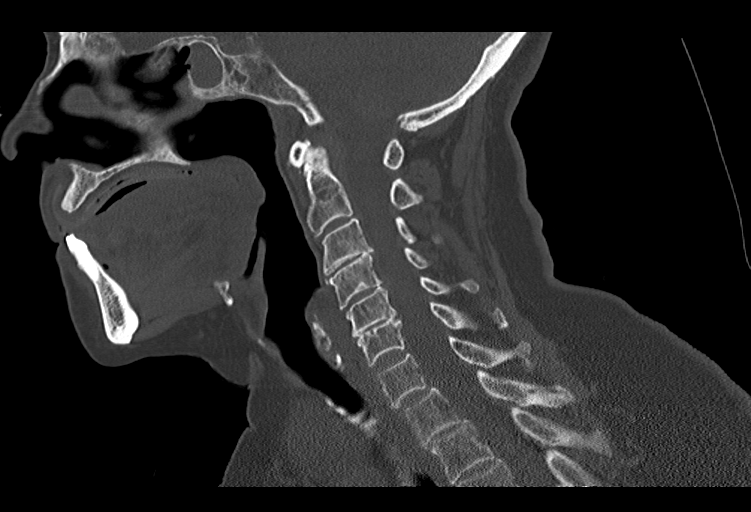
[im 60/103  bone]
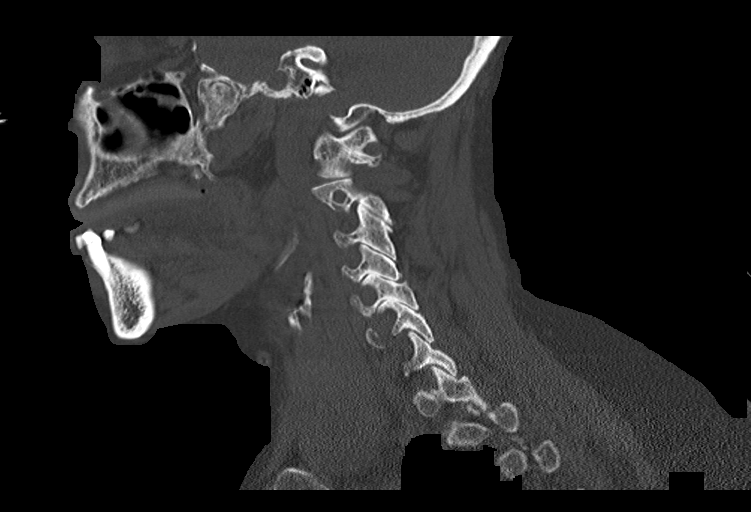
[im 69/103  bone]
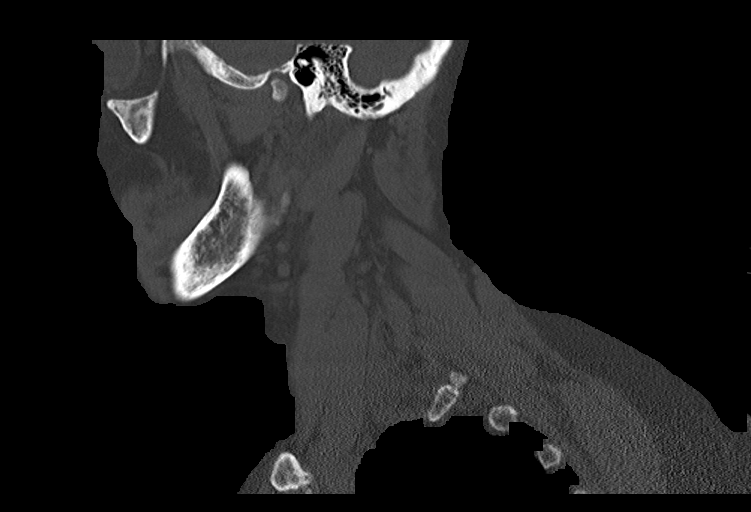

[Series 5: coronal bone · coronal · 0.40mm/px · 3 of 90 slices shown]
[im 30/90  bone]
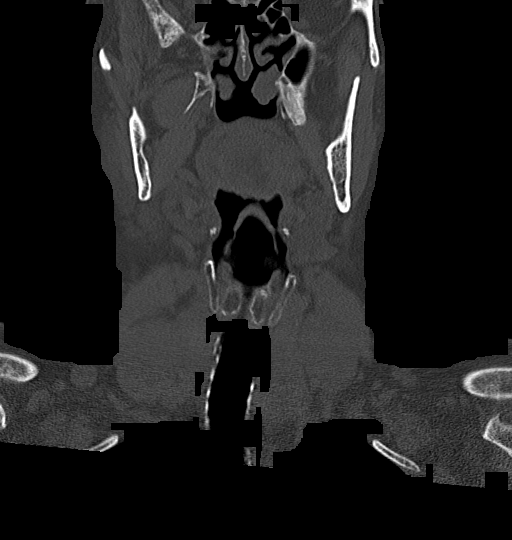
[im 40/90  bone]
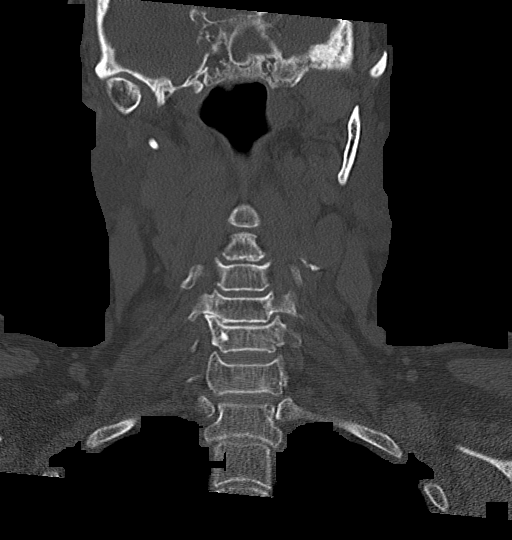
[im 50/90  bone]
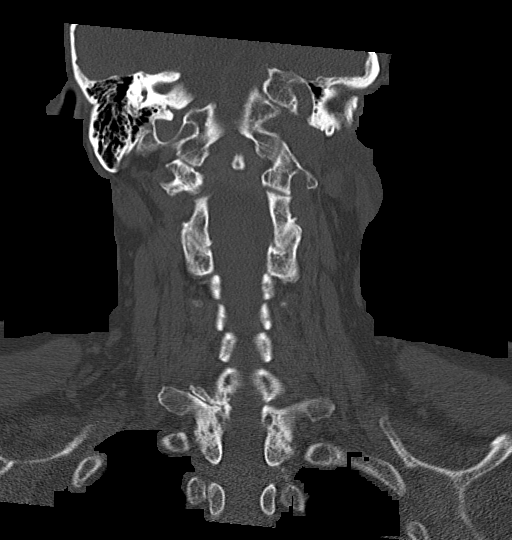

[Series 6: orthogonal bone · axial · 0.35mm/px · z∈[-50,+70]mm · 4 of 101 slices shown, 5 images]
[im 15/101  soft-tissue]
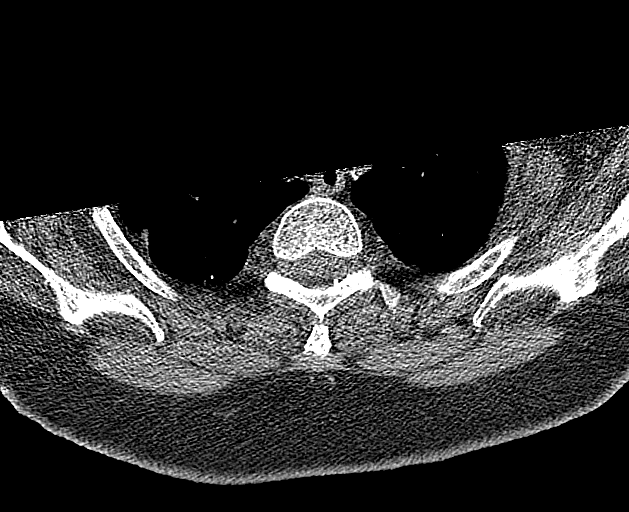
[im 15/101  bone]
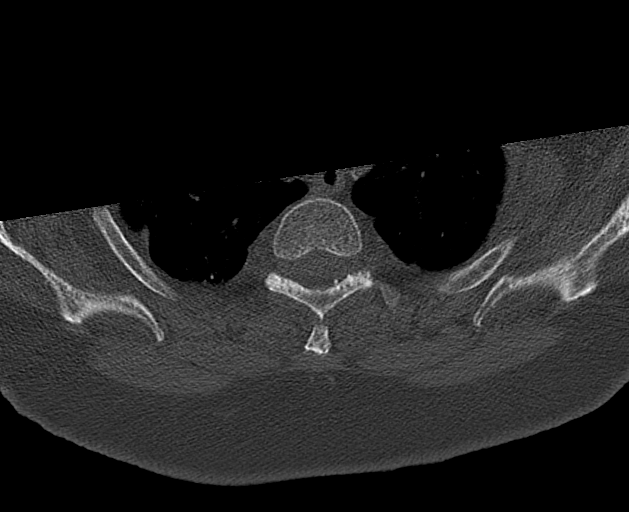
[im 43/101  bone]
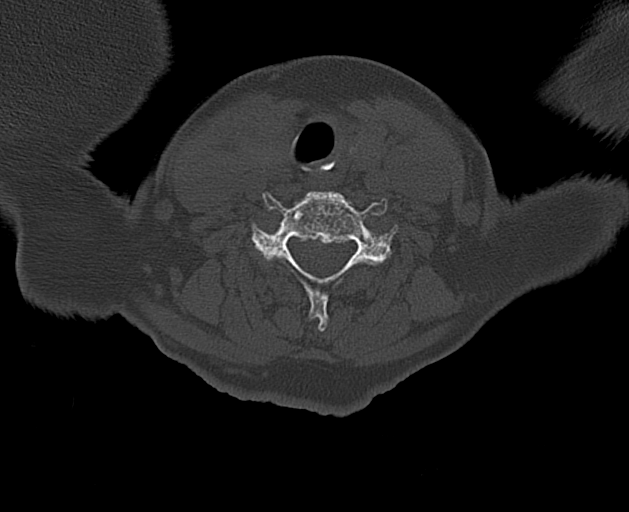
[im 58/101  bone]
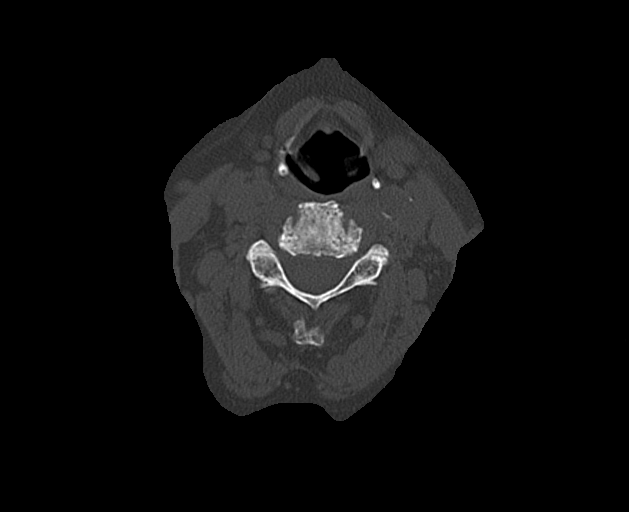
[im 86/101  bone]
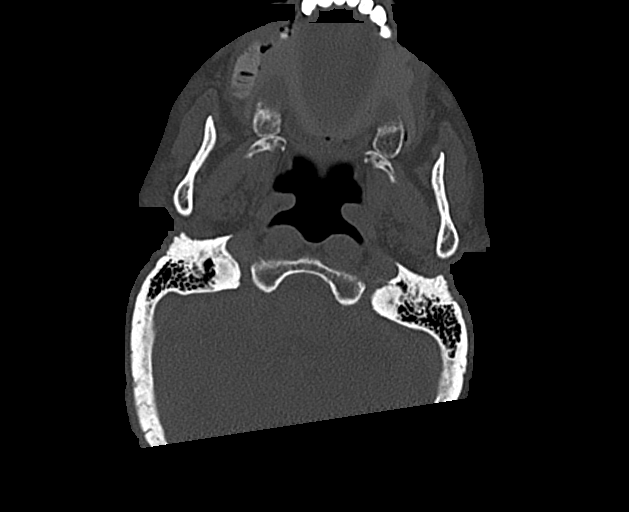

[12 of 35 positions shown; findings below may reference images not displayed]

FINDINGS: CT HEAD FINDINGS

Brain: Generalized atrophy. Normal ventricular morphology. No
midline shift or mass effect. Small vessel chronic ischemic changes
of deep cerebral white matter. No intracranial hemorrhage, mass
lesion, evidence of acute infarction, or extra-axial fluid
collection.

Vascular: Atherosclerotic calcification of internal carotid arteries
bilaterally at skull base. No hyperdense vessels

Skull: Small LEFT frontal scalp hematoma.  Calvaria intact

Sinuses/Orbits: Opacification of LEFT sphenoid sinus unchanged.
Remaining paranasal sinuses and mastoid air cells clear

Other: N/A

CT CERVICAL SPINE FINDINGS

Alignment: Mild anterolisthesis C7-T1 likely due to degenerative
disc and facet disease. Remaining alignments normal

Skull base and vertebrae: Osseous demineralization. Skull base
intact. Vertebral body heights maintained. Multilevel disc space
narrowing and endplate spur formation. Scattered facet degenerative
changes bilaterally. No fracture, additional subluxation, or bone
destruction. TMJ alignment normal.

Soft tissues and spinal canal: Prevertebral soft tissues normal
thickness. Atherosclerotic calcification at the carotid
bifurcations. Inferior LEFT thyroid mass 3.7 x 2.7 cm see below.

Disc levels:  No specific abnormalities

Upper chest: Lung apices clear

Other: N/A
IMPRESSION: Atrophy with small vessel chronic ischemic changes of deep cerebral
white matter.

No acute intracranial abnormalities.

Multilevel degenerative disc and facet disease changes of the
cervical spine.

No acute cervical spine abnormalities.

Inferior LEFT thyroid mass 2.7 x 3.7 cm; in the setting of
significant comorbidities or limited life expectancy, no follow-up
recommended (ref: [HOSPITAL]. [DATE]): 143-50).
